# Patient Record
Sex: Male | Born: 1975 | Race: Black or African American | Hispanic: No | Marital: Married | Smoking: Never smoker
Health system: Southern US, Community
[De-identification: ages and names within clinical notes are randomized; demographics above are authoritative.]

## PROBLEM LIST (undated history)

## (undated) DIAGNOSIS — J45909 Unspecified asthma, uncomplicated: Secondary | ICD-10-CM

## (undated) DIAGNOSIS — G43909 Migraine, unspecified, not intractable, without status migrainosus: Secondary | ICD-10-CM

## (undated) DIAGNOSIS — K219 Gastro-esophageal reflux disease without esophagitis: Secondary | ICD-10-CM

## (undated) HISTORY — PX: TONSILLECTOMY: SUR1361

## (undated) HISTORY — PX: APPENDECTOMY: SHX54

## (undated) HISTORY — PX: CHOLECYSTECTOMY: SHX55

---

## 1997-08-12 ENCOUNTER — Emergency Department (HOSPITAL_COMMUNITY): Admission: EM | Admit: 1997-08-12 | Discharge: 1997-08-12 | Payer: Self-pay | Admitting: Emergency Medicine

## 1999-05-19 ENCOUNTER — Emergency Department (HOSPITAL_COMMUNITY): Admission: EM | Admit: 1999-05-19 | Discharge: 1999-05-19 | Payer: Self-pay | Admitting: Emergency Medicine

## 1999-07-02 ENCOUNTER — Emergency Department (HOSPITAL_COMMUNITY): Admission: EM | Admit: 1999-07-02 | Discharge: 1999-07-02 | Payer: Self-pay | Admitting: Emergency Medicine

## 2000-01-11 ENCOUNTER — Emergency Department (HOSPITAL_COMMUNITY): Admission: EM | Admit: 2000-01-11 | Discharge: 2000-01-12 | Payer: Self-pay | Admitting: Internal Medicine

## 2000-01-17 ENCOUNTER — Ambulatory Visit (HOSPITAL_COMMUNITY): Admission: RE | Admit: 2000-01-17 | Discharge: 2000-01-17 | Payer: Self-pay | Admitting: Family Medicine

## 2000-01-24 ENCOUNTER — Encounter: Payer: Self-pay | Admitting: Emergency Medicine

## 2000-01-24 ENCOUNTER — Ambulatory Visit (HOSPITAL_COMMUNITY): Admission: RE | Admit: 2000-01-24 | Discharge: 2000-01-24 | Payer: Self-pay | Admitting: Emergency Medicine

## 2000-04-24 ENCOUNTER — Emergency Department (HOSPITAL_COMMUNITY): Admission: EM | Admit: 2000-04-24 | Discharge: 2000-04-24 | Payer: Self-pay | Admitting: Emergency Medicine

## 2000-04-26 ENCOUNTER — Emergency Department (HOSPITAL_COMMUNITY): Admission: EM | Admit: 2000-04-26 | Discharge: 2000-04-26 | Payer: Self-pay | Admitting: Emergency Medicine

## 2000-08-17 ENCOUNTER — Encounter: Payer: Self-pay | Admitting: Family Medicine

## 2000-08-17 ENCOUNTER — Ambulatory Visit (HOSPITAL_COMMUNITY): Admission: RE | Admit: 2000-08-17 | Discharge: 2000-08-17 | Payer: Self-pay | Admitting: Family Medicine

## 2000-09-06 ENCOUNTER — Emergency Department (HOSPITAL_COMMUNITY): Admission: EM | Admit: 2000-09-06 | Discharge: 2000-09-06 | Payer: Self-pay | Admitting: Emergency Medicine

## 2000-12-12 ENCOUNTER — Emergency Department (HOSPITAL_COMMUNITY): Admission: EM | Admit: 2000-12-12 | Discharge: 2000-12-12 | Payer: Self-pay | Admitting: Emergency Medicine

## 2000-12-12 ENCOUNTER — Encounter: Payer: Self-pay | Admitting: Emergency Medicine

## 2000-12-14 ENCOUNTER — Emergency Department (HOSPITAL_COMMUNITY): Admission: EM | Admit: 2000-12-14 | Discharge: 2000-12-15 | Payer: Self-pay | Admitting: Emergency Medicine

## 2000-12-14 ENCOUNTER — Encounter: Payer: Self-pay | Admitting: Emergency Medicine

## 2001-04-13 ENCOUNTER — Encounter: Admission: RE | Admit: 2001-04-13 | Discharge: 2001-04-13 | Payer: Self-pay | Admitting: Internal Medicine

## 2001-04-13 ENCOUNTER — Encounter: Payer: Self-pay | Admitting: Internal Medicine

## 2001-04-16 ENCOUNTER — Emergency Department (HOSPITAL_COMMUNITY): Admission: EM | Admit: 2001-04-16 | Discharge: 2001-04-16 | Payer: Self-pay | Admitting: Physical Therapy

## 2001-07-13 ENCOUNTER — Encounter: Payer: Self-pay | Admitting: Internal Medicine

## 2001-07-16 ENCOUNTER — Encounter: Payer: Self-pay | Admitting: Internal Medicine

## 2001-07-16 ENCOUNTER — Ambulatory Visit (HOSPITAL_COMMUNITY): Admission: RE | Admit: 2001-07-16 | Discharge: 2001-07-16 | Payer: Self-pay | Admitting: Internal Medicine

## 2002-03-01 ENCOUNTER — Emergency Department (HOSPITAL_COMMUNITY): Admission: EM | Admit: 2002-03-01 | Discharge: 2002-03-01 | Payer: Self-pay | Admitting: Emergency Medicine

## 2002-04-13 ENCOUNTER — Encounter: Admission: RE | Admit: 2002-04-13 | Discharge: 2002-04-13 | Payer: Self-pay | Admitting: Internal Medicine

## 2002-04-13 ENCOUNTER — Encounter: Payer: Self-pay | Admitting: Internal Medicine

## 2003-03-21 ENCOUNTER — Emergency Department (HOSPITAL_COMMUNITY): Admission: EM | Admit: 2003-03-21 | Discharge: 2003-03-21 | Payer: Self-pay

## 2003-06-06 ENCOUNTER — Inpatient Hospital Stay (HOSPITAL_COMMUNITY): Admission: EM | Admit: 2003-06-06 | Discharge: 2003-06-11 | Payer: Self-pay | Admitting: Emergency Medicine

## 2003-06-09 ENCOUNTER — Encounter (INDEPENDENT_AMBULATORY_CARE_PROVIDER_SITE_OTHER): Payer: Self-pay | Admitting: Cardiology

## 2003-07-19 ENCOUNTER — Encounter: Payer: Self-pay | Admitting: Internal Medicine

## 2004-05-09 ENCOUNTER — Ambulatory Visit: Payer: Self-pay | Admitting: Internal Medicine

## 2006-01-14 ENCOUNTER — Emergency Department (HOSPITAL_COMMUNITY): Admission: EM | Admit: 2006-01-14 | Discharge: 2006-01-15 | Payer: Self-pay | Admitting: Emergency Medicine

## 2006-08-19 ENCOUNTER — Emergency Department (HOSPITAL_COMMUNITY): Admission: EM | Admit: 2006-08-19 | Discharge: 2006-08-20 | Payer: Self-pay | Admitting: Emergency Medicine

## 2007-11-10 ENCOUNTER — Encounter: Admission: RE | Admit: 2007-11-10 | Discharge: 2007-11-10 | Payer: Self-pay | Admitting: Family Medicine

## 2008-07-12 DIAGNOSIS — Z862 Personal history of diseases of the blood and blood-forming organs and certain disorders involving the immune mechanism: Secondary | ICD-10-CM

## 2008-07-12 DIAGNOSIS — K219 Gastro-esophageal reflux disease without esophagitis: Secondary | ICD-10-CM | POA: Insufficient documentation

## 2008-07-12 DIAGNOSIS — K625 Hemorrhage of anus and rectum: Secondary | ICD-10-CM

## 2008-07-12 DIAGNOSIS — E785 Hyperlipidemia, unspecified: Secondary | ICD-10-CM

## 2008-07-12 DIAGNOSIS — R109 Unspecified abdominal pain: Secondary | ICD-10-CM | POA: Insufficient documentation

## 2008-07-13 ENCOUNTER — Ambulatory Visit: Payer: Self-pay | Admitting: Internal Medicine

## 2008-07-17 ENCOUNTER — Ambulatory Visit: Payer: Self-pay | Admitting: Internal Medicine

## 2010-06-28 NOTE — Consult Note (Signed)
NAME:  Randy Lowe, Randy Lowe                        ACCOUNT NO.:  [id number]   MEDICAL RECORD NO.:  [id number]                   PATIENT TYPE:  INP   LOCATION:  3737                                 FACILITY:  [facility]   PHYSICIAN:  Michael L. Thad Ranger, M.D.           DATE OF BIRTH:  1975/08/12   DATE OF CONSULTATION:  06/09/2003  DATE OF DISCHARGE:                                   CONSULTATION   REASON FOR CONSULTATION:  Syncope and weakness.   HISTORY OF PRESENT ILLNESS:  An inpatient consultation visit for this  existing [facility] patient, a 35 year old man, a couple of times  in the office in 2002 for a questionable syncopal episode and tension  vascular headaches.  MRI and MRA at that time were unremarkable.  The  patient was admitted Tuesday to the cardiology service due to an unusual  spell.  The patient reports that while sitting down at his desk for lunch  he began to experience chest pain.  He subsequently went out to lunch and  went to the bank but the chest pain gradually got worse.   After lunch, he came back to his desk and sat down.  He then remembers  feeling very lightheaded and then loss consciousness.  He does not really  remember much about the ensuing few hours.  According to his wife, who was  available in the room but who did not witness the episode first hand, he was  limp and that his head was hanging.  He was only minimally responsive  but apparently did try to respond to stimuli.  There was nothing that looked  like focal or generalized activity.  The patient did not suffer any injury.  EMS was alerted and the patient was brought to [facility]  Emergency Room where he had a complaint of chest pain and feeling poorly.  He was speaking in whispers but otherwise did not seem to have any  significant neurologic deficits.   He was admitted for further considerations.  We are concerned that this  might be a psychogenic phenomenon.  Dr. Antonietta Breach was consulted but he  denied any particular psychiatric syndrome except for possible adjustment  disorder.   The patient continued to complain of ongoing weakness and neurologic  consultation is requested for evaluation of this.  The patient currently  says that he has felt better.  Mostly, he simply that he feels tired.   PAST MEDICAL HISTORY:  1. She had a false positive Cardiolite, subsequent negative cardiac     catheterization as part of a chest pain workup in February of this year.  2. He had the aforementioned headache and syncope workup in 2002.  3. He has had a couple of MRIs of the abdomen the last few years the reasons     for which are unclear to me.  4. He does have anemia.  Workup is negative.  Probably has a thalassemia.  5. He has had appendectomy and cholecystectomy in the past.  6. As a child, he had frequent respiratory problems as well as migratory     joint pains.   SOCIAL HISTORY:  As outlined in the admission H&P of June 06, 2003.   FAMILY HISTORY:  As outlined in the admission H&P of June 06, 2003.   REVIEW OF SYMPTOMS:  As outlined in the admission H&P of June 06, 2003.   MEDICATIONS:  He was taking no medications prior to admission.  In the  hospital, he has been on Protonix, Relafen, and a prednisone taper.   PHYSICAL EXAMINATION:  VITAL SIGNS:  Temperature 98.2, blood pressure  116/70, pulse 57, respirations 20.  O2 saturation 100% on two liters nasal  cannula.  GENERAL:  This is a healthy-appearing man seated and in no evident distress.  HEENT:  Normocephalic and atraumatic.  Oropharynx benign.  NECK:  Supple without carotid bruits.  HEART:  Bradycardic, regular rhythm, no murmurs.  NEUROLOGICAL:  Mental status:  He is awake, alert, and fully oriented.  Recent and remote memory are intact.  Attention, concentration, and fund of  knowledge were appropriate.  He has no defect to confrontation or naming.  Mood is euthymic.  It looks flat  and a little depressed.  Cranial nerves  show the fundi are poorly visualized.  Pupils are equal and briskly  reactive.  Extraocular movements are full without nystagmus.  Visual fields  are full to confrontation.  Hearing is intact to conversation and speech.  Facial sensation is intact to pinprick.  Face, tongue, and palate move  normally and symmetrically.  Shoulder shrug is normal.  Motor testing shows  normal bulk and tone.  There is normal strength in all tested extremity  muscles with a bit of give way throughout.  Sensation intact to light touch  and pinprick in all extremities.  Coordination and rapid movements were  performed well.  Finger-to-nose and heel-to-shin were performed well.  Gait  shows he rises from a chair slowly but can do so under his own power.  He is  able to ambulate independently with contact guard assist.  Complaining  unsteadiness but demonstrating fairly good balance.  He does not follow the  Romberg maneuver.  Reflexes are 2+ and symmetric.  Toes are downgoing.   LABORATORY DATA:  CBC shows a white count of 9.2, hemoglobin 13.5, platelets  258,000.  BMET unremarkable except for a mildly low potassium at 3.2 and a  mildly elevated glucose of 1.6.  Sedimentation rate is 1.  CK is negative.  TSH is normal.  Urine drug screen is positive for opiates.   He has had no neural imaging during this admission.  He did have a MRI of  the brain ordered by Dr. Fleet Contras on April 13, 2003 which was  interpreted as normal.  He also had a normal study through our office in  2002 as noted above.   IMPRESSION:  1. Transient alteration of consciousness, etiology is uncertain.  I doubt     that he had a seizure.  Other consideration included a syncope, vasovagal     episode, most likely psychogenic event.  2. Weakness:  Strength is normal to my examination today and he does not     demonstrate any evidence of any particular identifiable neuromuscular    syndrome.  His  primary complaint actually is malaise.  3. History of migraine.   RECOMMENDATIONS:  We  will check an EEG.  It is okay to do this as an  outpatient.  I do not have any further specific recommendations for workup.  His most likely primary problem is anxiety and it may benefit him to be on  medication for this.  I thank you for the consultation.                                               Michael L. Thad Ranger, M.D.    MLR/MEDQ  D:  06/09/2003  T:  06/09/2003  Job:  [id number]

## 2010-06-28 NOTE — H&P (Signed)
NAME:  Randy Lowe, Randy Lowe                        ACCOUNT NO.:  [id number]   MEDICAL RECORD NO.:  [id number]                   PATIENT TYPE:  INP   LOCATION:  1830                                 FACILITY:  [facility]   PHYSICIAN:  Nanetta Batty, M.D.                DATE OF BIRTH:  05-26-1975   DATE OF ADMISSION:  06/06/2003  DATE OF DISCHARGE:                                HISTORY & PHYSICAL   CHIEF COMPLAINT:  Chest pain.   BRIEF HISTORY:  The patient is a 35 year old black male who developed chest  pain while at work sitting at his desk.  He described a sharp stabbing pain.  He got up and went to the bank.  On return, felt worse and developed some  shortness of breath and nausea. The patient reports he does not know what  happened after that.  EMS was called.  The patient responded only in  whispers with poor comprehension.  They attempted to call his wife and had  him talk to her on the phone, and he was unable to respond.  He was  subsequently transported to the [facility] ER.   Pain persisted until given nitroglycerin by EMTs wit a reported 12/10 chest  pain described initially with 4/10 and to 1/10 after nitroglycerin was  given. This is from the ENT report.  The patient had some complaints of  ongoing pain.  No shortness of breath, no PND, no orthopnea.  He is still  speaking in whispers, says he still feels terrible.  He has had similar  chest pain only two weeks ago, but it lasted only 30 minutes.  EKG in the ER  shows sinus rhythm with no ST-T wave changes.  Labs at point-of-care: CK-MB  2.2, troponin less than 0.05, myoglobin 108.  Sodium 140, potassium 3.4,  chloride 104, BUN 15, creatinine 1, glucose 113.  Hemoglobin 166, hematocrit  48. The pH is 7.37, bicarb 25.7, total CO2 27.   He was seen and evaluated by Dr. Allyson Sabal.  He is admitted for observation.  Dr. Allyson Sabal is concerned that this is more of a conversion reaction and not an  organic problem.  He has a prior  false positive Cardiolite and cardiac  catheterization March 30, 2003, which was negative.   PAST MEDICAL HISTORY:  1. MRI of the brain March 2004 negative.  2. MRI of the abdomen June 2003 and July 2002 negative.  3. CT of the brain x 3 in 2002 were negative, and he was diagnosed with     migraines.  4. Status post cholecystectomy.  5. Status post false positive Cardiolite February 2005 followed by cardiac     catheterization March 30, 2003, which showed normal coronary arteries,     normal LV function with no mitral regurgitation or mitral valve prolapse.  6. He has also undergone amenia workup, and hemoglobinopathy profile was     normal.  Iron TIBC was also normal.   CURRENT MEDICATIONS:  None.   ALLERGIES:  None.   REVIEW OF SYSTEMS:  He is working full time and has four kids, ages 68 to 8  months.  No syncope or other cardiovascular symptoms reported at this time.  CARDIAC:  Positive chest pain, see HPI.  PULMONARY:  He denies shortness of  breath except with the onset of symptoms.  No PND, no orthopnea, no dyspnea  on exertion.  GI:  Negative for GERD.  His weight is down slightly secondary  to a diet.  GU:  Negative.  He says he can maintain an erection without  difficulty.  EXTREMITIES:  Weak with these episodes but no other complaints.   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  He is  going to school for computer programming, and he has four kids as noted  above.   FAMILY HISTORY:  Positive for hypertension.  His father, whom he does not  know very well, has cancer and coronary artery disease.   PHYSICAL EXAMINATION:  GENERAL:  Well-nourished, well-developed black male  in no acute distress.  He speaks in whispers.  Eyes are frequently closed.  VITAL SIGNS: Blood pressure 132/70, heart rate 70 in sinus rhythm,  respiratory rate 20, O2 saturation 97% on room air.  HEENT:  Head: Normocephalic.  Eyes:  PERRLA. EOMs intact.  Fundi not   visualized.  Ears, nose, throat, and mouth grossly  within normal limits.  NECK:  No bruits, no JVD, no thyromegaly.  CHEST:  Clear to auscultation and percussion.  HEART:  Normal S1 and S2.  No murmurs or rubs.  ABDOMEN:  Soft, nontender.  Positive bowel sounds.  No hepatosplenomegaly.  GU/RECTAL:  Deferred, not medically indicated.  EXTREMITIES:  No clubbing, cyanosis, or edema.  NEUROLOGIC:  No focal deficits.   IMPRESSION:  1. Chest pain with normal cardiac catheterization.  2. Rule out conversion reaction.   PLAN:  1. Admit for 24-hour observation.  H2 blockers, and psychiatric consult.     Dr. Jeanie Sewer has been contacted.      Eber Hong, P.A.                 Nanetta Batty, M.D.    WDJ/MEDQ  D:  06/06/2003  T:  06/06/2003  Job:  [id number]

## 2010-06-28 NOTE — Discharge Summary (Signed)
NAME:  Randy Lowe, Randy Lowe                        ACCOUNT NO.:  [id number]   MEDICAL RECORD NO.:  [id number]                   PATIENT TYPE:  INP   LOCATION:  3737                                 FACILITY:  [facility]   PHYSICIAN:  Nanetta Batty, M.D.                DATE OF BIRTH:  March 24, 1975   DATE OF ADMISSION:  06/06/2003  DATE OF DISCHARGE:  06/11/2003                                 DISCHARGE SUMMARY   HISTORY OF PRESENT ILLNESS:  Mr. Sascha Baugher is a 35 year old African  American male who developed chest pain at work while at his desk.  It was a  sharp stabbing pain.  He developed some shortness of breath and nausea with  it.  It was recurrent.  EMS was called.  He apparently has undergone cardiac  catheterization on March 30, 2003, at which time he had normal coronary  arteries.  He has had a CT of his brain in 2002 for migraines.  It was  negative.  He has had an MR of his abdomen in June of 2003 and in July of  2002, which was negative.  He had another MRI of his brain in March of 2004,  which was also negative.  He has a normal LV.  He was seen by Nanetta Batty, M.D., in the ER.  He felt that it was probably not his heart given  the extensive recent workup that he has had.  He also thought that it was  not related to pulmonary edema and there may be a psychosomatic component to  it.   HOSPITAL COURSE:  He continued to have chest pain with worsening when he got  up and walked.  He did undergo CT scan of his chest, which was negative for  pulmonary emboli.  We then asked Dr. Jeanie Sewer to come to see him.  He was  also having a difficult time ambulating and needed assistance.  He would  occasionally lean to the left and the right knee started to buckle.  He also  complained about a blurry feeling and some presyncope symptoms when he got  up and walked.  He did have orthostatic blood pressures done and they were  negative.  Lying he was 120/80 and standing 140/90.  He did have  a consult  by Dr. Jeanie Sewer, who felt that he had no clear evidence of excessive  repression resulting in dissociated decreased memory.  He felt maybe that  therapy to increase his coping skills and stress management would do some  help for him.  However, the patient was indecisive if he wanted to have this  done.  On the morning of Jun 11, 2003, he was seen by Nanetta Batty, M.D.,  and considered stable to be discharged home.  He was then started on Zoloft.  He was also started on some meclozine to see if this would not help him  symptomatically.  LABORATORIES:  Hemoglobin 13.5, hematocrit 43, platelets 258.  ESR was 1.  Sodium 140, potassium 3.4, glucose 113, BUN 15, creatinine 1.0.  CK-MBs  were:  #1 312/3.1, #2 248/2.7, #3 214/2.2, #4 87/1.4.  His troponins were  all negative x 3 at less than 0.01.  Fasting lipids showed an HDL of 36, LDL  125, total cholesterol 174, and triglycerides 64.  The TSH was 1.5.  Urine  was negative for any pathology.   DISCHARGE MEDICATIONS:  1. Protonix 40 mg every day or Prilosec over the counter 20 mg once per day     x 2 weeks and then as needed.  2. Darvocet.  He can take as needed every six hours.  3. Motrin 400 mg one tablet t.i.d. for five days and then he is to take it     as needed only.  4. Zoloft 50 mg once per day.  5. Meclozine 25 mg.  He may take for extreme dizzy symptoms.   ACTIVITY:  He is to see his primary doctor, Dr. Everlene Other, before he goes back  to work.   FOLLOWUP:  If he needs counseling, he was left with a number that he could  call to get stress management counseling.  He will follow up with Dr. Clarene Duke  on Jul 03, 2003, at 10:30 a.m.  He will follow up with Dr. Everlene Other in one  week.  He will follow up with Dr. Anne Hahn if needed.   DISCHARGE DIAGNOSES:  1. Chest pain, not cardiac related.  CT of the chest was negative for     pulmonary emboli.  2. Weakness and malaise.  3. Normal ejection fraction.  4. Questionable  somatoform condition related to stress.      Lezlie Octave, N.P.                        Nanetta Batty, M.D.    BB/MEDQ  D:  08/07/2003  T:  08/07/2003  Job:  [id number]   cc:   Antonietta Breach, M.D.  [address]  [place], [place] [address]  Fax: [phone]   Tracey Harries, M.D.  [address]  [place]  [place] [address]  Fax: [phone]

## 2010-11-26 LAB — I-STAT 8, (EC8 V) (CONVERTED LAB)
BUN: 14
Chloride: 104
Glucose, Bld: 91
HCT: 53 — ABNORMAL HIGH
Hemoglobin: 18 — ABNORMAL HIGH
Operator id: 192351
pCO2, Ven: 53.6 — ABNORMAL HIGH

## 2010-11-26 LAB — POCT I-STAT CREATININE
Creatinine, Ser: 1.2
Operator id: 192351

## 2010-11-26 LAB — POCT CARDIAC MARKERS: Operator id: 192351

## 2011-02-17 ENCOUNTER — Ambulatory Visit
Admission: RE | Admit: 2011-02-17 | Discharge: 2011-02-17 | Disposition: A | Discharge status: Home / Self Care | Payer: No Typology Code available for payment source | Source: Ambulatory Visit | Attending: Physician Assistant | Admitting: Physician Assistant

## 2011-02-17 ENCOUNTER — Other Ambulatory Visit: Payer: Self-pay | Admitting: Physician Assistant

## 2011-02-17 DIAGNOSIS — R0602 Shortness of breath: Secondary | ICD-10-CM

## 2011-02-17 DIAGNOSIS — R05 Cough: Secondary | ICD-10-CM

## 2011-10-09 ENCOUNTER — Other Ambulatory Visit: Payer: Self-pay | Admitting: Otolaryngology

## 2013-02-20 ENCOUNTER — Emergency Department (HOSPITAL_COMMUNITY)
Admission: EM | Admit: 2013-02-20 | Discharge: 2013-02-20 | Disposition: A | Discharge status: Home / Self Care | Payer: 59 | Attending: Emergency Medicine | Admitting: Emergency Medicine

## 2013-02-20 ENCOUNTER — Emergency Department (HOSPITAL_COMMUNITY): Payer: 59

## 2013-02-20 ENCOUNTER — Encounter (HOSPITAL_COMMUNITY): Payer: Self-pay | Admitting: Emergency Medicine

## 2013-02-20 DIAGNOSIS — M25569 Pain in unspecified knee: Secondary | ICD-10-CM

## 2013-02-20 MED ORDER — TRAMADOL HCL 50 MG PO TABS: 50.0000 mg | ORAL_TABLET | Freq: Four times a day (QID) | ORAL | Status: DC | PRN

## 2013-02-20 NOTE — ED Notes (Signed)
Pt c/o bilateral knee/foot pain since October.  States that he has not seen anyone about it.  Denies injury.

## 2013-02-20 NOTE — Discharge Instructions (Signed)
Musculoskeletal Pain Musculoskeletal pain is muscle and boney aches and pains. These pains can occur in any part of the body. Your caregiver may treat you without knowing the cause of the pain. They may treat you if blood or urine tests, X-rays, and other tests were normal.  CAUSES There is often not a definite cause or reason for these pains. These pains may be caused by a type of germ (virus). The discomfort may also come from overuse. Overuse includes working out too hard when your body is not fit. Boney aches also come from weather changes. Bone is sensitive to atmospheric pressure changes. HOME CARE INSTRUCTIONS   Ask when your test results will be ready. Make sure you get your test results.  Only take over-the-counter or prescription medicines for pain, discomfort, or fever as directed by your caregiver. If you were given medications for your condition, do not drive, operate machinery or power tools, or sign legal documents for 24 hours. Do not drink alcohol. Do not take sleeping pills or other medications that may interfere with treatment.  Continue all activities unless the activities cause more pain. When the pain lessens, slowly resume normal activities. Gradually increase the intensity and duration of the activities or exercise.  During periods of severe pain, bed rest may be helpful. Lay or sit in any position that is comfortable.  Putting ice on the injured area.  Put ice in a bag.  Place a towel between your skin and the bag.  Leave the ice on for 15 to 20 minutes, 3 to 4 times a day.  Follow up with your caregiver for continued problems and no reason can be found for the pain. If the pain becomes worse or does not go away, it may be necessary to repeat tests or do additional testing. Your caregiver may need to look further for a possible cause. SEEK IMMEDIATE MEDICAL CARE IF:  You have pain that is getting worse and is not relieved by medications.  You develop chest pain  that is associated with shortness or breath, sweating, feeling sick to your stomach (nauseous), or throw up (vomit).  Your pain becomes localized to the abdomen.  You develop any new symptoms that seem different or that concern you. MAKE SURE YOU:   Understand these instructions.  Will watch your condition.  Will get help right away if you are not doing well or get worse. Document Released: 01/27/2005 Document Revised: 04/21/2011 Document Reviewed: 10/01/2012 [facility] Patient Information 2014 [place], [place].  Knee Pain Knee pain can be a result of an injury or other medical conditions. Treatment will depend on the cause of your pain. HOME CARE  Only take medicine as told by your doctor.  Keep a healthy weight. Being overweight can make the knee hurt more.  Stretch before exercising or playing sports.  If there is constant knee pain, change the way you exercise. Ask your doctor for advice.  Make sure shoes fit well. Choose the right shoe for the sport or activity.  Protect your knees. Wear kneepads if needed.  Rest when you are tired. GET HELP RIGHT AWAY IF:   Your knee pain does not stop.  Your knee pain does not get better.  Your knee joint feels hot to the touch.  You have a fever. MAKE SURE YOU:   Understand these instructions.  Will watch this condition.  Will get help right away if you are not doing well or get worse. Document Released: 04/25/2008 Document Revised: 04/21/2011 Document  Reviewed: 04/25/2008 ExitCare Patient Information 2014 [place], [place].

## 2013-02-20 NOTE — ED Provider Notes (Signed)
CSN: [id number]     Arrival date & time 02/20/13  1656 History  This chart was scribed for non-physician practitioner, Junious Silk, PA-C,working with Shon Baton, MD, by Karle Plumber, ED Scribe.  This patient was seen in room WTR6/WTR6 and the patient's care was started at 5:50 PM.  Chief Complaint  Patient presents with  . Knee Pain   The history is provided by the patient. No language interpreter was used.   HPI Comments:  Randy Lowe is a 38 y.o. male who presents to the Emergency Department complaining of bilateral knee pain with the worst being his right knee for the last three months. He states he feels as if his left knee is hurting due to over compensating for his right knee. Pt states his pain has been worsening and prevents him from moving in certain directions. He states getting up from a sitting position and getting into his car makes the pain worse and has even fallen from these actions. He reports taking Ibuprofen and wearing a knee brace on his right knee with no relief from either. He states he injured the knee several years ago, but denies any recent injury. He has never had any surgeries to this knee. He denies fever, chills, nausea, vomiting, SOB, CP, or trouble breathing.   History reviewed. No pertinent past medical history. No past surgical history on file. No family history on file. History  Substance Use Topics  . Smoking status: Never Smoker   . Smokeless tobacco: Not on file  . Alcohol Use: No    Review of Systems  Constitutional: Negative for fever.  Musculoskeletal: Positive for arthralgias (bilateral knee pain).  All other systems reviewed and are negative.    Allergies  Meloxicam  Home Medications  No current outpatient prescriptions on file. Triage Vitals: BP 158/94  Pulse 87  Temp(Src) 98.9 F (37.2 C) (Oral)  SpO2 99% Physical Exam  Nursing note and vitals reviewed. Constitutional: He is oriented to person, place, and time.  He appears well-developed and well-nourished. No distress.  HENT:  Head: Normocephalic and atraumatic.  Right Ear: External ear normal.  Left Ear: External ear normal.  Nose: Nose normal.  Eyes: Conjunctivae are normal.  Neck: Normal range of motion. No tracheal deviation present.  Cardiovascular: Normal rate, regular rhythm and normal heart sounds.   Pulses:      Posterior tibial pulses are 2+ on the right side, and 2+ on the left side.  Pulmonary/Chest: Effort normal and breath sounds normal. No stridor.  Abdominal: Soft. He exhibits no distension. There is no tenderness.  Musculoskeletal: Normal range of motion. He exhibits tenderness. He exhibits no edema.  Positive apprehension sign on right knee. Tender to palpation on medial joint line on right.  No calf pain or swelling.   Neurological: He is alert and oriented to person, place, and time.  Skin: Skin is warm and dry. He is not diaphoretic.  Psychiatric: He has a normal mood and affect. His behavior is normal.    ED Course  Procedures (including critical care time) DIAGNOSTIC STUDIES: Oxygen Saturation is 99% on RA, normal by my interpretation.   COORDINATION OF CARE: 5:55 PM-Will X-Ray right knee. Pt verbalizes understanding and agrees to plan.  Medications - No data to display  Labs Review Labs Reviewed - No data to display Imaging Review Dg Knee Complete 4 Views Right  02/20/2013   CLINICAL DATA:  Three-month history of anterior right knee pain. No known injuries.  EXAM:  RIGHT KNEE - COMPLETE 4+ VIEW  COMPARISON:  None.  FINDINGS: No evidence of acute or subacute fracture or dislocation. Well preserved joint spaces. Well preserved bone mineral density. Small joint effusion suspected.  IMPRESSION: No osseous abnormality.  Small joint effusion suspected.   Electronically Signed   By: Hulan Saashomas  Lawrence M.D.   On: 02/20/2013 18:10    EKG Interpretation   None       MDM   1. Knee pain, unspecified laterality     Patient presents to ED with knee pain, right worse than left. XR is unremarkable. Compartment soft, neurovascularly intact. Patient can walk, but states it is painful. Well preserved joint spaces. Patient was given orthopedic follow up. I discussed return instructions with patient. Vital signs stable for discharge. Patient / Family / Caregiver informed of clinical course, understand medical decision-making process, and agree with plan.   I personally performed the services described in this documentation, which was scribed in my presence. The recorded information has been reviewed and is accurate.    Mora BellmanHannah S Pinkney Venard, PA-C 02/20/13 [id number]

## 2013-02-21 NOTE — ED Provider Notes (Signed)
Medical screening examination/treatment/procedure(s) were performed by non-physician practitioner and as supervising physician I was immediately available for consultation/collaboration.  EKG Interpretation   None        Flint Hakeem F Koni Kannan, MD 02/21/13 0022 

## 2013-11-25 ENCOUNTER — Other Ambulatory Visit: Payer: Self-pay

## 2014-08-11 ENCOUNTER — Encounter: Payer: Self-pay | Admitting: Internal Medicine

## 2016-07-21 DIAGNOSIS — J45909 Unspecified asthma, uncomplicated: Secondary | ICD-10-CM | POA: Insufficient documentation

## 2017-11-16 DIAGNOSIS — IMO0002 Reserved for concepts with insufficient information to code with codable children: Secondary | ICD-10-CM | POA: Diagnosis present

## 2018-01-05 ENCOUNTER — Encounter (HOSPITAL_COMMUNITY): Payer: Self-pay | Admitting: *Deleted

## 2018-01-05 ENCOUNTER — Observation Stay (HOSPITAL_COMMUNITY)
Admission: EM | Admit: 2018-01-05 | Discharge: 2018-01-06 | Disposition: A | Discharge status: Home / Self Care | Payer: BLUE CROSS/BLUE SHIELD | Attending: Internal Medicine | Admitting: Internal Medicine

## 2018-01-05 ENCOUNTER — Emergency Department (HOSPITAL_COMMUNITY): Payer: BLUE CROSS/BLUE SHIELD

## 2018-01-05 ENCOUNTER — Other Ambulatory Visit: Payer: Self-pay

## 2018-01-05 DIAGNOSIS — Z791 Long term (current) use of non-steroidal anti-inflammatories (NSAID): Secondary | ICD-10-CM | POA: Diagnosis not present

## 2018-01-05 DIAGNOSIS — Z7982 Long term (current) use of aspirin: Secondary | ICD-10-CM | POA: Insufficient documentation

## 2018-01-05 DIAGNOSIS — K219 Gastro-esophageal reflux disease without esophagitis: Secondary | ICD-10-CM | POA: Diagnosis not present

## 2018-01-05 DIAGNOSIS — IMO0002 Reserved for concepts with insufficient information to code with codable children: Secondary | ICD-10-CM | POA: Diagnosis present

## 2018-01-05 DIAGNOSIS — Z9049 Acquired absence of other specified parts of digestive tract: Secondary | ICD-10-CM | POA: Diagnosis not present

## 2018-01-05 DIAGNOSIS — G43901 Migraine, unspecified, not intractable, with status migrainosus: Secondary | ICD-10-CM | POA: Diagnosis not present

## 2018-01-05 DIAGNOSIS — J45909 Unspecified asthma, uncomplicated: Secondary | ICD-10-CM | POA: Diagnosis not present

## 2018-01-05 DIAGNOSIS — D649 Anemia, unspecified: Secondary | ICD-10-CM | POA: Insufficient documentation

## 2018-01-05 DIAGNOSIS — Z888 Allergy status to other drugs, medicaments and biological substances status: Secondary | ICD-10-CM | POA: Diagnosis not present

## 2018-01-05 DIAGNOSIS — Z79899 Other long term (current) drug therapy: Secondary | ICD-10-CM | POA: Insufficient documentation

## 2018-01-05 DIAGNOSIS — G43909 Migraine, unspecified, not intractable, without status migrainosus: Secondary | ICD-10-CM | POA: Diagnosis present

## 2018-01-05 DIAGNOSIS — Z862 Personal history of diseases of the blood and blood-forming organs and certain disorders involving the immune mechanism: Secondary | ICD-10-CM

## 2018-01-05 HISTORY — DX: Gastro-esophageal reflux disease without esophagitis: K21.9

## 2018-01-05 HISTORY — DX: Migraine, unspecified, not intractable, without status migrainosus: G43.909

## 2018-01-05 HISTORY — DX: Unspecified asthma, uncomplicated: J45.909

## 2018-01-05 LAB — COMPREHENSIVE METABOLIC PANEL
ALT: 27 U/L (ref 0–44)
AST: 31 U/L (ref 15–41)
Albumin: 3.7 g/dL (ref 3.5–5.0)
Alkaline Phosphatase: 65 U/L (ref 38–126)
Anion gap: 9 (ref 5–15)
BILIRUBIN TOTAL: 0.3 mg/dL (ref 0.3–1.2)
BUN: 10 mg/dL (ref 6–20)
CO2: 21 mmol/L — ABNORMAL LOW (ref 22–32)
Calcium: 9 mg/dL (ref 8.9–10.3)
Chloride: 107 mmol/L (ref 98–111)
Creatinine, Ser: 1.35 mg/dL — ABNORMAL HIGH (ref 0.61–1.24)
Glucose, Bld: 177 mg/dL — ABNORMAL HIGH (ref 70–99)
POTASSIUM: 4 mmol/L (ref 3.5–5.1)
Sodium: 137 mmol/L (ref 135–145)
Total Protein: 7.2 g/dL (ref 6.5–8.1)

## 2018-01-05 LAB — CBC WITH DIFFERENTIAL/PLATELET
Abs Immature Granulocytes: 0.1 10*3/uL — ABNORMAL HIGH (ref 0.00–0.07)
Basophils Absolute: 0 10*3/uL (ref 0.0–0.1)
Basophils Relative: 0 %
EOS ABS: 0 10*3/uL (ref 0.0–0.5)
EOS PCT: 0 %
HCT: 46.5 % (ref 39.0–52.0)
Hemoglobin: 13.5 g/dL (ref 13.0–17.0)
Lymphocytes Relative: 23 %
Lymphs Abs: 1.4 10*3/uL (ref 0.7–4.0)
MCH: 19 pg — AB (ref 26.0–34.0)
MCHC: 29 g/dL — AB (ref 30.0–36.0)
MCV: 65.6 fL — AB (ref 80.0–100.0)
MONO ABS: 0 10*3/uL — AB (ref 0.1–1.0)
Metamyelocytes Relative: 1 %
Monocytes Relative: 0 %
Neutro Abs: 4.7 10*3/uL (ref 1.7–7.7)
Neutrophils Relative %: 76 %
PLATELETS: 274 10*3/uL (ref 150–400)
RBC: 7.09 MIL/uL — AB (ref 4.22–5.81)
RDW: 17.3 % — AB (ref 11.5–15.5)
WBC: 6.2 10*3/uL (ref 4.0–10.5)
nRBC: 0 % (ref 0.0–0.2)
nRBC: 0 /100 WBC

## 2018-01-05 MED ORDER — DIPHENHYDRAMINE HCL 50 MG/ML IJ SOLN
12.5000 mg | Freq: Once | INTRAMUSCULAR | Status: AC
  Administered 2018-01-05: 12.5 mg via INTRAVENOUS
  Filled 2018-01-05: qty 1

## 2018-01-05 MED ORDER — AMITRIPTYLINE HCL 25 MG PO TABS
25.0000 mg | ORAL_TABLET | Freq: Every day | ORAL | Status: DC
  Administered 2018-01-05: 25 mg via ORAL
  Filled 2018-01-05: qty 1

## 2018-01-05 MED ORDER — ONDANSETRON HCL 4 MG PO TABS: 4.0000 mg | ORAL_TABLET | Freq: Four times a day (QID) | ORAL | Status: DC | PRN

## 2018-01-05 MED ORDER — KETOROLAC TROMETHAMINE 15 MG/ML IJ SOLN
15.0000 mg | Freq: Once | INTRAMUSCULAR | Status: AC
  Administered 2018-01-05: 15 mg via INTRAVENOUS
  Filled 2018-01-05: qty 1

## 2018-01-05 MED ORDER — KETOROLAC TROMETHAMINE 30 MG/ML IJ SOLN
30.0000 mg | Freq: Once | INTRAMUSCULAR | Status: AC
  Administered 2018-01-05: 30 mg via INTRAVENOUS
  Filled 2018-01-05: qty 1

## 2018-01-05 MED ORDER — DEXAMETHASONE SODIUM PHOSPHATE 10 MG/ML IJ SOLN
10.0000 mg | Freq: Once | INTRAMUSCULAR | Status: AC
  Administered 2018-01-05: 10 mg via INTRAVENOUS
  Filled 2018-01-05: qty 1

## 2018-01-05 MED ORDER — LORAZEPAM 2 MG/ML IJ SOLN
1.0000 mg | Freq: Once | INTRAMUSCULAR | Status: AC
  Administered 2018-01-05: 1 mg via INTRAVENOUS
  Filled 2018-01-05: qty 1

## 2018-01-05 MED ORDER — SODIUM CHLORIDE 0.9 % IV BOLUS
1000.0000 mL | Freq: Once | INTRAVENOUS | Status: AC
  Administered 2018-01-05: 1000 mL via INTRAVENOUS

## 2018-01-05 MED ORDER — ACETAMINOPHEN 325 MG PO TABS
650.0000 mg | ORAL_TABLET | Freq: Four times a day (QID) | ORAL | Status: DC | PRN
  Administered 2018-01-06: 650 mg via ORAL
  Filled 2018-01-05: qty 2

## 2018-01-05 MED ORDER — DIHYDROERGOTAMINE MESYLATE 1 MG/ML IJ SOLN
1.0000 mg | Freq: Once | INTRAMUSCULAR | Status: AC
  Administered 2018-01-05: 1 mg via SUBCUTANEOUS
  Filled 2018-01-05: qty 1

## 2018-01-05 MED ORDER — ONDANSETRON HCL 4 MG/2ML IJ SOLN: 4.0000 mg | Freq: Four times a day (QID) | INTRAMUSCULAR | Status: DC | PRN

## 2018-01-05 MED ORDER — MAGNESIUM SULFATE 2 GM/50ML IV SOLN
2.0000 g | Freq: Once | INTRAVENOUS | Status: AC
  Administered 2018-01-05: 2 g via INTRAVENOUS
  Filled 2018-01-05: qty 50

## 2018-01-05 MED ORDER — METOCLOPRAMIDE HCL 5 MG/ML IJ SOLN
10.0000 mg | Freq: Once | INTRAMUSCULAR | Status: AC
  Administered 2018-01-05: 10 mg via INTRAVENOUS
  Filled 2018-01-05: qty 2

## 2018-01-05 MED ORDER — ZOLPIDEM TARTRATE 5 MG PO TABS
5.0000 mg | ORAL_TABLET | Freq: Once | ORAL | Status: AC
  Administered 2018-01-06: 5 mg via ORAL
  Filled 2018-01-05: qty 1

## 2018-01-05 MED ORDER — DIPHENHYDRAMINE HCL 50 MG/ML IJ SOLN
25.0000 mg | Freq: Once | INTRAMUSCULAR | Status: AC
  Administered 2018-01-05: 25 mg via INTRAVENOUS
  Filled 2018-01-05: qty 1

## 2018-01-05 MED ORDER — ACETAMINOPHEN 650 MG RE SUPP: 650.0000 mg | Freq: Four times a day (QID) | RECTAL | Status: DC | PRN

## 2018-01-05 NOTE — ED Notes (Signed)
Pt placed on cardiac monitor. NSR at this time.

## 2018-01-05 NOTE — ED Triage Notes (Signed)
Pt has been having a migraine for the past 2 days unrelieved with OTC meds. Hx of same; reports photophobia as well

## 2018-01-05 NOTE — ED Provider Notes (Signed)
[facility] EMERGENCY DEPARTMENT Provider Note   CSN: [id number] Arrival date & time: 01/05/18  0539     History   Chief Complaint Chief Complaint  Patient presents with  . Migraine    HPI Randy Lowe is a 42 y.o. male with past medical history significant for recurrent migraines who presents for evaluation of migraine headache. Symptoms began two days ago. Admits to left sided temporal headache. Describes it has a throbbing sensation. Denies radiation. States the pain began gradually and has increased over time. Rates his pain a 10/10. States that he was on Imatrex his migraines, however the patient stopped working and he no longer has access to it.  Patient states his neurologist, [facility] neurolog[facility], recently changed over his medicines to Indocin and amitriptyline.  She states these medicines have not helped with his headaches since he started them recurrently approximately 7 weeks ago.  Patient states he does have a history of chronic left-sided headache with left-sided weakness.  Patient states he was supposed to have an MRI with [facility] approximately 6 weeks ago, however has not scheduled this.  Admits to photophobia and phonophobia.  Denies fever, chills, neck pain, neck stiffness, chest pain, shortness of breath, cough, hemoptysis, unilateral weakness, slurred speech, eye pain, dizziness or lightheadedness. Patient reports "heavines in his left upper and lower extremities. Has has this in the past with his previous migraines. Denies head trauma or anticoagulation.  History obtained from patient.  No interpreter was used.  HPI  Past Medical History:  Diagnosis Date  . Asthma   . GERD (gastroesophageal reflux disease)   . Migraine     Patient Active Problem List   Diagnosis Date Noted  . HYPERLIPIDEMIA 07/12/2008  . GERD 07/12/2008  . RECTAL BLEEDING 07/12/2008  . ABDOMINAL PAIN, UNSPECIFIED SITE 07/12/2008  . ANEMIA, HX OF 07/12/2008    Past  Surgical History:  Procedure Laterality Date  . APPENDECTOMY    . CHOLECYSTECTOMY    . TONSILLECTOMY          Home Medications    Prior to Admission medications   Medication Sig Start Date End Date Taking? Authorizing Provider  amitriptyline (ELAVIL) 25 MG tablet Take 25 mg by mouth at bedtime. 11/18/17  Yes [provider]  aspirin-acetaminophen-caffeine (EXCEDRIN MIGRAINE) [phone] MG tablet Take 1 tablet by mouth every 6 (six) hours as needed for headache.   Yes [provider]  ibuprofen (ADVIL,MOTRIN) 200 MG tablet Take 800 mg by mouth every 6 (six) hours as needed.   Yes [provider]  indomethacin (INDOCIN SR) 75 MG CR capsule Take 75 mg by mouth as directed. Take 1 capsule at onset of migraine, may repeat after 12 hours if no relief. 11/18/17  Yes [provider]  ondansetron (ZOFRAN-ODT) 4 MG disintegrating tablet Take 4 mg by mouth every 6 (six) hours as needed for nausea/vomiting. 11/18/17  Yes [provider]  traMADol (ULTRAM) 50 MG tablet Take 1 tablet (50 mg total) by mouth every 6 (six) hours as needed. Patient not taking: Reported on 01/05/2018 02/20/13   Junious Silk, PA-C    Family History No family history on file.  Social History Social History   Tobacco Use  . Smoking status: Never Smoker  . Smokeless tobacco: Never Used  Substance Use Topics  . Alcohol use: No  . Drug use: No     Allergies   Meloxicam   Review of Systems Review of Systems  Constitutional: Negative.  HENT: Negative.   Respiratory: Negative.   Cardiovascular: Negative.   Gastrointestinal: Negative.   Genitourinary: Negative.   Musculoskeletal: Negative.   Skin: Negative.   Neurological: Positive for headaches. Negative for dizziness, tremors, seizures, syncope, facial asymmetry, speech difficulty, weakness, light-headedness and numbness.  All other systems reviewed and are negative.    Physical Exam Updated Vital Signs BP  137/89 (BP Location: Left Arm)   Pulse 88   Temp 98.6 F (37 C) (Oral)   Resp 20   Ht 6\' 2"  (1.88 m)   Wt 131.5 kg   SpO2 99%   BMI 37.23 kg/m   Physical Exam  Physical Exam  Constitutional: Pt is oriented to person, place, and time. Pt appears well-developed and well-nourished. No distress.  HENT:  Head: Normocephalic and atraumatic.  Mouth/Throat: Oropharynx is clear and moist.  Eyes: Conjunctivae and EOM are normal. Pupils are equal, round, and reactive to light. No scleral icterus.  No horizontal, vertical or rotational nystagmus  Neck: Normal range of motion. Neck supple.  Full active and passive ROM without pain No midline or paraspinal tenderness No nuchal rigidity or meningeal signs  Cardiovascular: Normal rate, regular rhythm and intact distal pulses.   Pulmonary/Chest: Effort normal and breath sounds normal. No respiratory distress. Pt has no wheezes. No rales.  Abdominal: Soft. Bowel sounds are normal. There is no tenderness. There is no rebound and no guarding.  Musculoskeletal: Normal range of motion.  Lymphadenopathy:    No cervical adenopathy.  Neurological: Pt. is alert and oriented to person, place, and time. He has normal reflexes. No cranial nerve deficit.  Exhibits normal muscle tone. Coordination normal.  Mental Status:  Alert, oriented, thought content appropriate. Speech fluent without evidence of aphasia. Able to follow 2 step commands without difficulty.  Cranial Nerves:  II:  Peripheral visual fields grossly normal, pupils equal, round, reactive to light III,IV, VI: ptosis not present, extra-ocular motions intact bilaterally  V,VII: smile symmetric, facial light touch sensation equal VIII: hearing grossly normal bilaterally  IX,X: midline uvula rise  XI: bilateral shoulder shrug equal and strong XII: midline tongue extension  Motor:  5/5 in upper and lower extremities bilaterally including strong and dorsiflexion/plantar flexion. 4/5 Decreased grip  strength in left hand. Sensory: Pinprick and light touch normal in all extremities.  Deep Tendon Reflexes: 2+ and symmetric  Cerebellar: normal finger-to-nose with bilateral upper extremities Gait: normal gait and balance CV: distal pulses palpable throughout   Skin: Skin is warm and dry. No rash noted. Pt is not diaphoretic.  Psychiatric: Pt has a normal mood and affect. Behavior is normal. Judgment and thought content normal.  Nursing note and vitals reviewed. ED Treatments / Results  Labs (all labs ordered are listed, but only abnormal results are displayed) Labs Reviewed  COMPREHENSIVE METABOLIC PANEL  CBC WITH DIFFERENTIAL/PLATELET    EKG None  Radiology Mr Brain Wo Contrast  Result Date: 01/05/2018 CLINICAL DATA:  42 year old male with worst headache of life. Photophobia. History of migraines. EXAM: MRI HEAD WITHOUT CONTRAST TECHNIQUE: Multiplanar, multiecho pulse sequences of the brain and surrounding structures were obtained without intravenous contrast. COMPARISON:  Skull radiographs 11/10/2007. FINDINGS: Brain: No restricted diffusion to suggest acute infarction. No midline shift, mass effect, evidence of mass lesion, ventriculomegaly, extra-axial collection or acute intracranial hemorrhage. Cervicomedullary junction and pituitary are within normal limits. Cerebral volume is within normal limits. Wallace Cullens and white matter signal is within normal limits throughout the brain. No encephalomalacia or chronic cerebral blood products identified. The  deep gray matter nuclei, brainstem, and cerebellum appear normal. Vascular: Major intracranial vascular flow voids are preserved, the distal right vertebral artery appears dominant. Skull and upper cervical spine: Negative visible cervical spine. Normal bone marrow signal. Sinuses/Orbits: Negative orbits. Trace paranasal sinus mucosal thickening. Other: Mastoid air cells are clear. Grossly normal visible internal auditory structures. Scalp and  face soft tissues appear negative. IMPRESSION: Normal noncontrast MRI appearance of the brain. Electronically Signed   By: Odessa Fleming M.D.   On: 01/05/2018 10:42    Procedures Procedures (including critical care time)  Medications Ordered in ED Medications  diphenhydrAMINE (BENADRYL) injection 12.5 mg (12.5 mg Intravenous Given 01/05/18 0759)  metoCLOPramide (REGLAN) injection 10 mg (10 mg Intravenous Given 01/05/18 0800)  sodium chloride 0.9 % bolus 1,000 mL (0 mLs Intravenous Stopped 01/05/18 0934)  LORazepam (ATIVAN) injection 1 mg (1 mg Intravenous Given 01/05/18 0932)  magnesium sulfate IVPB 2 g 50 mL (0 g Intravenous Stopped 01/05/18 1139)  dexamethasone (DECADRON) injection 10 mg (10 mg Intravenous Given 01/05/18 1113)  ketorolac (TORADOL) 15 MG/ML injection 15 mg (15 mg Intravenous Given 01/05/18 1111)  dihydroergotamine (DHE) injection 1 mg (1 mg Subcutaneous Given 01/05/18 1344)     Initial Impression / Assessment and Plan / ED Course  I have reviewed the triage vital signs and the nursing notes.  Pertinent labs & imaging results that were available during my care of the patient were reviewed by me and considered in my medical decision making (see chart for details).  42 year old male who appears otherwise well presents for evaluation of headache.  Symptom onset 2 days ago.  Patient admits to left-sided headache.  Has history of chronic left-sided headache with associated left-sided weakness. Patient is followed by [facility] neurology. Patient states he supposed to have an MRI approximately 6 weeks ago, however has not scheduled this.  Neurologic exam with decreased grip strength in left hand. No dysmetria. No pronator drift. No facial asymmetry or difficulty with word finding. No code stroke called d/t onset of symptoms >48 hours. Possible complex migraine vs intracranial etiology. Patient is mildly hypertensive in department at 153/94. Feel this is secondary to pain at this time.     Thorough review of past medical history shows patient was seen by [facility] 11/28/2017 for history of chronic migraine with left-sided weakness.  States he was previously seen by [facility] neurology and was referred to [facility] due to intractable headache.  Patient was instructed to have an emergent MRI secondary to weakness, however did not schedule this.  Patient has not followed up with [facility] neurology since this visit.  Will give migraine cocktail and obtain MRI brain w/o and re-evalaute.  0915: Pain on reevaluation not improved with Reglan, benadryl and fluids.   1045: MRI negative. Will trial Toradol, decadron and magnesium as well as high flow oxygen. Patient does not wan to try Depakote.   1230: Pain with improvement from 9/10 to 8/10. Will trial DHE and reevaluate. Patient does not have a history of cardiac disorders. If not improved patient will need to be admitted for status migrainous.   1430: Patient without any relief with DHE, states pain back to a 10/10. Low suspicion for meningitis or other emergent intracranial pathology causing symptoms at this time. Will call for admission for status migranosis.  1530: Discussed with Neurology PA, who agrees for consult  1545: Discussed with Dr. Ophelia Charter, [facility] hospitalist who agree for admission.  Patient is hemodynamically stable and appropriate for admission  at this time.  Patient has been seen an evaluated by my attending Dr. Donnald GarrePfeiffer who agrees with above treatment and disposition.   Final Clinical Impressions(s) / ED Diagnoses   Final diagnoses:  Status migrainosus    ED Discharge Orders    None       Yenifer Saccente A, PA-C 01/05/18 1559    Arby BarrettePfeiffer, Marcy, MD 01/07/18 1246

## 2018-01-05 NOTE — H&P (Signed)
History and Physical    Randy Lowe:[id number] DOB: 11/08/1975 DOA: 01/05/2018  PCP: Patient, No Pcp Per Consultants:  Duanne Moron [facility] Neurology Patient coming from:  Home - lives with wife and 4 children; NOK: Wife, [phone]  Chief Complaint: Migraine  HPI: Randy Lowe is a 42 y.o. male with medical history significant of migraines presenting with a migraine.     He reports a h/o migraine and has a severe migraine, worse than any he has had.  It started fairly mild Sunday PM and "just exploded."  Left temporal headache radiating along the left posterior ear and into the jaw and teeth.  +blurry vision.  +photophobia.  He usually has aura the day before his migraine but he did not have that this time.  Left arm and leg feel heavy and his fingers feel pins and needles on the left.  His left foot occasionally feels like it is asleep.  +nausea, no vomiting.  Intermittent dysphagia with this migraine.     ED Course:  Status migrainous.  H/o chronic migraines, followed by [facility] neurology.  Negative MRI.  Tried multiple medications without relief.  Neurology recommends DHE injections, need [place] to admit.  Review of Systems: As per HPI; otherwise review of systems reviewed and negative.   Ambulatory Status:  Ambulates without assistance or with a cane  Past Medical History:  Diagnosis Date  . Asthma   . GERD (gastroesophageal reflux disease)   . Migraine     Past Surgical History:  Procedure Laterality Date  . APPENDECTOMY    . CHOLECYSTECTOMY    . TONSILLECTOMY      Social History   Socioeconomic History  . Marital status: Married    Spouse name: Not on file  . Number of children: Not on file  . Years of education: Not on file  . Highest education level: Not on file  Occupational History  . Occupation: Tree surgeon  Social Needs  . Financial resource strain: Not on file  . Food insecurity:    Worry: Not on file    Inability: Not on file  . Transportation  needs:    Medical: Not on file    Non-medical: Not on file  Tobacco Use  . Smoking status: Never Smoker  . Smokeless tobacco: Never Used  Substance and Sexual Activity  . Alcohol use: No  . Drug use: No  . Sexual activity: Not on file  Lifestyle  . Physical activity:    Days per week: Not on file    Minutes per session: Not on file  . Stress: Not on file  Relationships  . Social connections:    Talks on phone: Not on file    Gets together: Not on file    Attends religious service: Not on file    Active member of club or organization: Not on file    Attends meetings of clubs or organizations: Not on file    Relationship status: Not on file  . Intimate partner violence:    Fear of current or ex partner: Not on file    Emotionally abused: Not on file    Physically abused: Not on file    Forced sexual activity: Not on file  Other Topics Concern  . Not on file  Social History Narrative  . Not on file    Allergies  Allergen Reactions  . Meloxicam Itching    Family History  Problem Relation Age of Onset  . CAD Father   .  Colon cancer Father   . CVA Maternal Grandmother   . CAD Maternal Grandmother   . CAD Maternal Grandfather   . Migraines Neg Hx     Prior to Admission medications   Medication Sig Start Date End Date Taking? Authorizing Provider  amitriptyline (ELAVIL) 25 MG tablet Take 25 mg by mouth at bedtime. 11/18/17  Yes [provider]  aspirin-acetaminophen-caffeine (EXCEDRIN MIGRAINE) [phone] MG tablet Take 1 tablet by mouth every 6 (six) hours as needed for headache.   Yes [provider]  ibuprofen (ADVIL,MOTRIN) 200 MG tablet Take 800 mg by mouth every 6 (six) hours as needed.   Yes [provider]  indomethacin (INDOCIN SR) 75 MG CR capsule Take 75 mg by mouth as directed. Take 1 capsule at onset of migraine, may repeat after 12 hours if no relief. 11/18/17  Yes [provider]  ondansetron (ZOFRAN-ODT) 4 MG  disintegrating tablet Take 4 mg by mouth every 6 (six) hours as needed for nausea/vomiting. 11/18/17  Yes [provider]  traMADol (ULTRAM) 50 MG tablet Take 1 tablet (50 mg total) by mouth every 6 (six) hours as needed. Patient not taking: Reported on 01/05/2018 02/20/13   Junious SilkMerrell, Hannah, PA-C    Physical Exam: Vitals:   01/05/18 0543 01/05/18 0802 01/05/18 1126 01/05/18 1349  BP: (!) 162/97 (!) 153/94 (!) 142/92 137/89  Pulse: 97 90 79 88  Resp: 16 18 20 20   Temp: 98 F (36.7 C) 98.6 F (37 C)    TempSrc: Oral Oral    SpO2: 99%  100% 99%  Weight:      Height:         General:  Appears calm and comfortable and is NAD Eyes:  PERRL, EOMI, normal lids, iris ENT:  grossly normal hearing, lips & tongue, mmm; appropriate dentition Neck:  no LAD, masses or thyromegaly; no carotid bruits Cardiovascular:  RRR, no m/r/g. No LE edema.  Respiratory:   CTA bilaterally with no wheezes/rales/rhonchi.  Normal respiratory effort. Abdomen:  soft, NT, ND, NABS Back:   normal alignment, no CVAT Skin:  no rash or induration seen on limited exam Musculoskeletal:  grossly normal tone BUE/BLE, good ROM, no bony abnormality - inconsistent effort on exam but appears to be intact Lower extremity:  No LE edema.  Limited foot exam with no ulcerations.  2+ distal pulses. Psychiatric:  grossly normal mood and affect, speech fluent and appropriate, AOx3 Neurologic:  CN 2-12 grossly intact, moves all extremities in coordinated fashion, reports diminished sensation along entire left face/arm/leg    Radiological Exams on Admission: Mr Brain Wo Contrast  Result Date: 01/05/2018 CLINICAL DATA:  42 year old male with worst headache of life. Photophobia. History of migraines. EXAM: MRI HEAD WITHOUT CONTRAST TECHNIQUE: Multiplanar, multiecho pulse sequences of the brain and surrounding structures were obtained without intravenous contrast. COMPARISON:  Skull radiographs 11/10/2007. FINDINGS: Brain: No  restricted diffusion to suggest acute infarction. No midline shift, mass effect, evidence of mass lesion, ventriculomegaly, extra-axial collection or acute intracranial hemorrhage. Cervicomedullary junction and pituitary are within normal limits. Cerebral volume is within normal limits. Wallace CullensGray and white matter signal is within normal limits throughout the brain. No encephalomalacia or chronic cerebral blood products identified. The deep gray matter nuclei, brainstem, and cerebellum appear normal. Vascular: Major intracranial vascular flow voids are preserved, the distal right vertebral artery appears dominant. Skull and upper cervical spine: Negative visible cervical spine. Normal bone marrow signal. Sinuses/Orbits: Negative orbits. Trace paranasal sinus mucosal thickening. Other: Mastoid air  cells are clear. Grossly normal visible internal auditory structures. Scalp and face soft tissues appear negative. IMPRESSION: Normal noncontrast MRI appearance of the brain. Electronically Signed   By: Odessa Fleming M.D.   On: 01/05/2018 10:42    EKG: not done   Labs on Admission: I have personally reviewed the available labs and imaging studies at the time of the admission.  Pertinent labs:   CBC, CMP pending   Assessment/Plan Principal Problem:   Status migrainosus   -Patient presenting with report of migraine -While he reports new neurologic symptoms, it appears that he had similar symptoms at an outpatient consult appointment on 11/18/17 -His PE is unremarkable including neurologically, although there appears to be inconsistent effort intermittently -He is sitting in a well lighted hallway and is alert, appropriate, and fairly jovial -MRI was negative -Overall low suspicion for serious condition -Will observe overnight -Did not appear to respond to ER treatments including Toradol, Ativan, Magnesium, Decadron, and Reglan -Neurology consulted and has ordered DHE -Anticipate d/c to home tomorrow    DVT  prophylaxis: Early ambulation Code Status:  Full - confirmed with patient/family Family Communication: Son and daughter present throughout evaluation  Disposition Plan:  Home once clinically improved Consults called: Neurology  Admission status: It is my clinical opinion that referral for OBSERVATION is reasonable and necessary in this patient based on the above information provided. The aforementioned taken together are felt to place the patient at high risk for further clinical deterioration. However it is anticipated that the patient may be medically stable for discharge from the hospital within 24 to 48 hours.    Jonah Blue MD [facility]  If note is complete, please contact covering daytime or nighttime physician. [email or web address] Password [facility]  01/05/2018, 4:18 PM

## 2018-01-05 NOTE — ED Provider Notes (Signed)
Medical screening examination/treatment/procedure(s) were conducted as a shared visit with non-physician practitioner(s) and myself.  I personally evaluated the patient during the encounter.  None Patient with past medical history significant for migraines.  Current headache has been ongoing for 2 days.  Patient reports that he has left-sided weakness with his headache.  Patient is scheduled to get an MRI 6 weeks ago but has not had it done yet.  I have evaluated the patient after treatment with migraine cocktail.  He is alert and appropriate without confusion or somnolence but reports headache still present.  Movements coordinated purposeful symmetric.  Patient is in no acute distress.  No respiratory distress.  Heart regular.  Lungs clear.  Skin warm and dry.  I have supervised PA-C tenderly and administration of full migraine therapy including DHE and Decadron.  Patient has not had significant improvement.  Will consult neurology for status migrainosus.  CRITICAL CARE Performed by: Kiyo Heal PfeiffeArby Barretter   Total critical care time: 30  minutes  Critical care time was exclusive of separately billable procedures and treating other patients.  Critical care was necessary to treat or prevent imminent or life-threatening deterioration.  Critical care was time spent personally by me on the following activities: development of treatment plan with patient and/or surrogate as well as nursing, discussions with consultants, evaluation of patient's response to treatment, examination of patient, obtaining history from patient or surrogate, ordering and performing treatments and interventions, ordering and review of laboratory studies, ordering and review of radiographic studies, pulse oximetry and re-evaluation of patient's condition.   Arby BarrettePfeiffer, Desma Wilkowski, MD 01/07/18 1245

## 2018-01-05 NOTE — Consult Note (Addendum)
Neurology Consultation  Reason for Consult: Migraine headache Referring Physician: Ophelia Charter  CC: Headache  History is obtained from: Patient  HPI: Randy Lowe is a 42 y.o. male with past medical history of migraine, asthma and GERD.  Patient states on Sunday he was driving and noted that he had a left-sided headache that came on suddenly with some neck pain.  Over the course of a few days it has become worse.  Patient states that although it somewhat feels like his normal migraine with the light sensitivity and sound sensitivity it is much stronger than usual and is constant.  He rates it as a 10/10.  States that the headache is mostly in the left occipital, parietal, frontal and some portions of the left side of his face with photophobia and phonophobia.  He also notes that he has some left neck pain.  While in the ED he has received Decadron 10 mg once, migraine cocktail considering of Toradol-Benadryl-Reglan, he has received Ativan,  1 dose of 1 mg DHE, along with magnesium.  Patient states her normal migraine for him has photophobia phonophobia and usually located over the left frontal region and sometimes over his left eye.  He used to use Imitrex however the last headache that did not work thus he was prescribed indomethacin and Toradol.  Currently he is in the hallway, sitting up at times looking at his phone in the setting of bright lights showing no discomfort.  But still states he has a 10/10 headache.  LKW: 01/03/2018  Outpatient treatment: On October 9 of 2019 patient was consulted by [facility], for increasing frequency and severity of headaches.  That time he stated to those caretakers that his headache is always there but the intensity does vary.  At that time he rated his headache an 8/10.  Again the location was on left side of the head and either in the occipital region or around the temporal region.  At that time he describes stabbing and sharp pain at  onset then after medication would go to a dull ache.  He has stated that he had tried Motrin, Benadryl, naproxen, and Tylenol with no relief.  He has been given Imitrex and Maxalt for abortive therapy but this only provided brief relief.  At that time he did have an MRI of the brain with and without contrast.  It was read as normal appearance on all pulse sequences without evidence of atrophy, stroke, mass, hydrocephalus and no abnormal enhancements.  Patient was prescribed Elavil 25 mg that he will take at night.  He was also provided a prescription of indomethacin 75 mg to be taken at the onset of new migraine headaches.  ROS: A 14 point ROS was performed and is negative except as noted in the HPI.   Past Medical History:  Diagnosis Date  . Asthma   . GERD (gastroesophageal reflux disease)   . Migraine      Family History  Problem Relation Age of Onset  . CAD Father   . Colon cancer Father   . CVA Maternal Grandmother   . CAD Maternal Grandmother   . CAD Maternal Grandfather   . Migraines Neg Hx      Social History:   reports that he has never smoked. He has never used smokeless tobacco. He reports that he does not drink alcohol or use drugs.  Medications  Current Facility-Administered Medications:  .  acetaminophen (TYLENOL) tablet 650 mg, 650 mg, Oral, Q6H PRN **  OR** acetaminophen (TYLENOL) suppository 650 mg, 650 mg, Rectal, Q6H PRN, Jonah BlueYates, Jennifer, MD .  amitriptyline (ELAVIL) tablet 25 mg, 25 mg, Oral, QHS, Jonah BlueYates, Jennifer, MD .  ondansetron [facility](ZOFRAN) tablet 4 mg, 4 mg, Oral, Q6H PRN **OR** ondansetron (ZOFRAN) injection 4 mg, 4 mg, Intravenous, Q6H PRN, Jonah BlueYates, Jennifer, MD  Current Outpatient Medications:  .  amitriptyline (ELAVIL) 25 MG tablet, Take 25 mg by mouth at bedtime., Disp: , Rfl:  .  aspirin-acetaminophen-caffeine (EXCEDRIN MIGRAINE) 250-250-65 MG tablet, Take 1 tablet by mouth every 6 (six) hours as needed for headache., Disp: , Rfl:  .  ibuprofen  (ADVIL,MOTRIN) 200 MG tablet, Take 800 mg by mouth every 6 (six) hours as needed., Disp: , Rfl:  .  indomethacin (INDOCIN SR) 75 MG CR capsule, Take 75 mg by mouth as directed. Take 1 capsule at onset of migraine, may repeat after 12 hours if no relief., Disp: , Rfl:  .  ondansetron (ZOFRAN-ODT) 4 MG disintegrating tablet, Take 4 mg by mouth every 6 (six) hours as needed for nausea/vomiting., Disp: , Rfl:  .  traMADol (ULTRAM) 50 MG tablet, Take 1 tablet (50 mg total) by mouth every 6 (six) hours as needed. (Patient not taking: Reported on 01/05/2018), Disp: 15 tablet, Rfl: 0   Exam: Current vital signs: BP 137/89 (BP Location: Left Arm)   Pulse 88   Temp 98.6 F (37 C) (Oral)   Resp 20   Ht 6\' 2"  (1.88 m)   Wt 131.5 kg   SpO2 99%   BMI 37.23 kg/m  Vital signs in last 24 hours: Temp:  [98 F (36.7 C)-98.6 F (37 C)] 98.6 F (37 C) (11/26 0802) Pulse Rate:  [79-97] 88 (11/26 1349) Resp:  [16-20] 20 (11/26 1349) BP: (137-162)/(89-97) 137/89 (11/26 1349) SpO2:  [99 %-100 %] 99 % (11/26 1349) Weight:  [131.5 kg] 131.5 kg (11/26 0542)  Physical Exam  Constitutional: Appears well-developed and well-nourished.  Psych: At this point patient states he is a 10/10 headache however he does not appear to be in any severe pain, able to tolerate bright lights and sitting comfortably looking at his phone. Eyes: No scleral injection HENT: No OP obstrucion Head: Normocephalic.  Cardiovascular: Normal rate and regular rhythm.  Respiratory: Effort normal, non-labored breathing GI: Soft.  No distension. There is no tenderness.  Skin: WDI  Neuro: Mental Status: Patient is awake, alert, oriented to person, place, month, year, and situation. Patient is able to give a clear and coherent history. No signs of aphasia or neglect Cranial Nerves: II: Visual Fields are full. Pupils are equal, round, and reactive to light.   III,IV, VI: EOMI without ptosis or diploplia.  V: Facial sensation is  symmetric to temperature VII: Facial movement is symmetric.  VIII: hearing is intact to voice X: Uvula elevates symmetrically XI: Shoulder shrug is symmetric. XII: tongue is midline without atrophy or fasciculations.  Motor: Tone is normal. Bulk is normal. 5/5 strength was present in all four extremities.  Of note when testing the left arm patient had to give way weakness with bicep flexion, tricep extension and shoulder abduction. Sensory: Sensation is symmetric to light touch and temperature in the arms and legs. Deep Tendon Reflexes: 2+ and symmetric in the biceps and patellae.  Plantars: Toes are downgoing bilaterally.  Cerebellar: FNF and HKS are intact bilaterally  Labs I have reviewed labs in epic and the results pertinent to this consultation are:   CBC    Component Value Date/Time  HGB 18.0 (H) 08/19/2006 2303   HCT 53.0 (H) 08/19/2006 2303    CMP     Component Value Date/Time   NA 140 08/19/2006 2303   K 4.1 08/19/2006 2303   CL 104 08/19/2006 2303   GLUCOSE 91 08/19/2006 2303   BUN 14 08/19/2006 2303   CREATININE 1.2 08/19/2006 2303    Lipid Panel  No results found for: CHOL, TRIG, HDL, CHOLHDL, VLDL, LDLCALC, LDLDIRECT   Imaging I have reviewed the images obtained:  MRI examination of the brain-normal noncontrast MRI appearance of the brain.  Felicie Morn PA-C [facility] [phone]  M-F  (9:00 am- 5:00 PM)  01/05/2018, 4:27 PM     Assessment:  42 year old male with history of migraine headaches who is been seen by [facility] in the past recently this year.  At that time he was prescribed Elavil and indomethacin.  Now arriving at [facility] secondary to progressively increased headache.  Patient has had multiple medication as noted above.  MRI is negative. Patient received DHE without much improvement. He does seem fairly comfortable though.   Status Migrainosus  Recommendations: Load with IV valproic acid  1500mg  Continue PRN zofran,reglan Continue Amitriptyline      NEUROHOSPITALIST ADDENDUM Performed a face to face diagnostic evaluation.   I have reviewed the contents of history and physical exam as documented by PA/ARNP/Resident and agree with above documentation.  I have discussed and formulated the above plan as documented. Edits to the note have been made as needed.  Throbbing headache with nausea/vomiting. Likely status migrainosus. MRI brain unremarkable.  Patient did not respond to migraine cocktail in ER. Also received DHE without much relief.  Will start patient on Valproic acid. Also PRN reglan. On exam, patient did not appear in asmuch distress and with lights on in the room.     Georgiana Spinner Vinny Taranto MD [facility] [phone]   If 7pm to 7am, please call on call as listed on AMION.

## 2018-01-06 ENCOUNTER — Encounter (HOSPITAL_COMMUNITY): Payer: Self-pay | Admitting: Internal Medicine

## 2018-01-06 DIAGNOSIS — G43901 Migraine, unspecified, not intractable, with status migrainosus: Secondary | ICD-10-CM | POA: Diagnosis not present

## 2018-01-06 LAB — HIV ANTIBODY (ROUTINE TESTING W REFLEX): HIV Screen 4th Generation wRfx: NONREACTIVE

## 2018-01-06 MED ORDER — DIHYDROERGOTAMINE MESYLATE 1 MG/ML IJ SOLN
1.0000 mg | Freq: Once | INTRAMUSCULAR | Status: AC
  Administered 2018-01-06: 1 mg via INTRAVENOUS
  Filled 2018-01-06: qty 1

## 2018-01-06 MED ORDER — DIPHENHYDRAMINE HCL 50 MG/ML IJ SOLN
12.5000 mg | Freq: Four times a day (QID) | INTRAMUSCULAR | Status: DC | PRN
  Administered 2018-01-06: 12.5 mg via INTRAVENOUS
  Filled 2018-01-06: qty 1

## 2018-01-06 MED ORDER — METOCLOPRAMIDE HCL 5 MG/ML IJ SOLN
10.0000 mg | Freq: Once | INTRAMUSCULAR | Status: AC
  Administered 2018-01-06: 10 mg via INTRAVENOUS
  Filled 2018-01-06: qty 2

## 2018-01-06 MED ORDER — VALPROATE SODIUM 500 MG/5ML IV SOLN
1500.0000 mg | Freq: Once | INTRAVENOUS | Status: AC
  Administered 2018-01-06: 1500 mg via INTRAVENOUS
  Filled 2018-01-06: qty 15

## 2018-01-06 MED ORDER — METOCLOPRAMIDE HCL 5 MG/5ML PO SOLN
10.0000 mg | Freq: Once | ORAL | Status: AC
  Administered 2018-01-06: 10 mg via ORAL
  Filled 2018-01-06 (×2): qty 10

## 2018-01-06 MED ORDER — DIPHENHYDRAMINE HCL 50 MG/ML IJ SOLN
25.0000 mg | Freq: Once | INTRAMUSCULAR | Status: AC
  Administered 2018-01-06: 25 mg via INTRAVENOUS
  Filled 2018-01-06: qty 1

## 2018-01-06 MED ORDER — MAGNESIUM SULFATE 2 GM/50ML IV SOLN
2.0000 g | Freq: Once | INTRAVENOUS | Status: AC
  Administered 2018-01-06: 2 g via INTRAVENOUS
  Filled 2018-01-06: qty 50

## 2018-01-06 MED ORDER — KETOROLAC TROMETHAMINE 30 MG/ML IJ SOLN
30.0000 mg | Freq: Once | INTRAMUSCULAR | Status: AC
  Administered 2018-01-06: 30 mg via INTRAVENOUS
  Filled 2018-01-06: qty 1

## 2018-01-06 MED ORDER — GABAPENTIN 300 MG PO CAPS: 300.0000 mg | ORAL_CAPSULE | Freq: Three times a day (TID) | ORAL | 0 refills | Status: DC

## 2018-01-06 MED ORDER — METOCLOPRAMIDE HCL 5 MG/ML IJ SOLN: 10.0000 mg | Freq: Four times a day (QID) | INTRAMUSCULAR | Status: DC | PRN

## 2018-01-06 NOTE — Discharge Summary (Signed)
Physician Discharge Summary  Randy Lowe ZOX:[id number] DOB: [birth year] DOA: 01/05/2018  PCP: Patient, No Pcp Per  Admit date: 01/05/2018 Discharge date: 01/06/2018  Time spent: 40 minutes  Recommendations for Outpatient Follow-up:  1. Follow up with primary neurologist 1-2 weeks for evaluation of headaches 2. Take medications as directed   Discharge Diagnoses:  Principal Problem:   Status migrainosus Active Problems:   ANEMIA, HX OF   Discharge Condition: stabel  Diet recommendation: carb modified  Filed Weights   01/05/18 0542 01/05/18 1651  Weight: 131.5 kg 131.5 kg    History of present illness:   Past medical hx includes migraine, asthma and gerd. Presented to ED 11/26 with cc 2 day hx headache. Located left side started suddenly. Associated symptoms include left neck pain mild tingly of left hand/led. Patient stated normal migraine for him has photophobia phonophobia and usually located over the left frontal region and sometimes over his left eye.  He used to use Imitrex however the last headache that did not work thus he was prescribed indomethacin and Toradol.  Hospital Course:  1.Status migrainosus. Hx of same. Seen by [facility]. Given elavil and indomethacin. MRI negative. Provided with valproic acid and elavil.  Evaluated by neuro who recommend toredol, reglan and depake. Improved slightly day of discharge. Per Neuro, one more dose toredol, depakote and reglan and discharge with gabapentin 300mg  TID for 3 days. Follow up with OP neuro  Recommendations:  Continue Amitriptyline F/u with outpatient neurology Levert Feinstein, FNP at [facility]   Procedures:    Consultations:  Dr Amada Jupiter  Discharge Exam: Vitals:   01/06/18 0559 01/06/18 0915  BP: (!) 141/94 (!) 142/96  Pulse: 82 91  Resp: 16 17  Temp: (!) 97.5 F (36.4 C) 97.6 F (36.4 C)  SpO2: 98% 96%    General: sitting up in bed watching tv  Cardiovascular: rrr no MGR no  LEedema Respiratory: normal effort BS clear bilaterally. No wheeze or rhonchi  Discharge Instructions   Discharge Instructions    Call MD for:  difficulty breathing, headache or visual disturbances   Complete by:  As directed    Call MD for:  persistant dizziness or light-headedness   Complete by:  As directed    Diet - low sodium heart healthy   Complete by:  As directed    Discharge instructions   Complete by:  As directed    Take medications as directed Follow up with primary neurologist 1-2 weeks   Increase activity slowly   Complete by:  As directed      Allergies as of 01/06/2018      Reactions   Meloxicam Itching      Medication List    STOP taking these medications   traMADol 50 MG tablet Commonly known as:  ULTRAM     TAKE these medications   amitriptyline 25 MG tablet Commonly known as:  ELAVIL Take 25 mg by mouth at bedtime.   aspirin-acetaminophen-caffeine 250-250-65 MG tablet Commonly known as:  EXCEDRIN MIGRAINE Take 1 tablet by mouth every 6 (six) hours as needed for headache.   gabapentin 300 MG capsule Commonly known as:  NEURONTIN Take 1 capsule (300 mg total) by mouth 3 (three) times daily for 3 days.   ibuprofen 200 MG tablet Commonly known as:  ADVIL,MOTRIN Take 800 mg by mouth every 6 (six) hours as needed.   indomethacin 75 MG CR capsule Commonly known as:  INDOCIN SR Take 75 mg by mouth as directed. Take  1 capsule at onset of migraine, may repeat after 12 hours if no relief.   ondansetron 4 MG disintegrating tablet Commonly known as:  ZOFRAN-ODT Take 4 mg by mouth every 6 (six) hours as needed for nausea/vomiting.      Allergies  Allergen Reactions  . Meloxicam Itching      The results of significant diagnostics from this hospitalization (including imaging, microbiology, ancillary and laboratory) are listed below for reference.    Significant Diagnostic Studies: Mr Brain 68Wo Contrast  Result Date: 01/05/2018 CLINICAL  DATA:  42 year old male with worst headache of life. Photophobia. History of migraines. EXAM: MRI HEAD WITHOUT CONTRAST TECHNIQUE: Multiplanar, multiecho pulse sequences of the brain and surrounding structures were obtained without intravenous contrast. COMPARISON:  Skull radiographs 11/10/2007. FINDINGS: Brain: No restricted diffusion to suggest acute infarction. No midline shift, mass effect, evidence of mass lesion, ventriculomegaly, extra-axial collection or acute intracranial hemorrhage. Cervicomedullary junction and pituitary are within normal limits. Cerebral volume is within normal limits. Wallace CullensGray and white matter signal is within normal limits throughout the brain. No encephalomalacia or chronic cerebral blood products identified. The deep gray matter nuclei, brainstem, and cerebellum appear normal. Vascular: Major intracranial vascular flow voids are preserved, the distal right vertebral artery appears dominant. Skull and upper cervical spine: Negative visible cervical spine. Normal bone marrow signal. Sinuses/Orbits: Negative orbits. Trace paranasal sinus mucosal thickening. Other: Mastoid air cells are clear. Grossly normal visible internal auditory structures. Scalp and face soft tissues appear negative. IMPRESSION: Normal noncontrast MRI appearance of the brain. Electronically Signed   By: Odessa FlemingH  Hall M.D.   On: 01/05/2018 10:42    Microbiology: No results found for this or any previous visit (from the past 240 hour(s)).   Labs: Basic Metabolic Panel: Recent Labs  Lab 01/05/18 1954  NA 137  K 4.0  CL 107  CO2 21*  GLUCOSE 177*  BUN 10  CREATININE 1.35*  CALCIUM 9.0   Liver Function Tests: Recent Labs  Lab 01/05/18 1954  AST 31  ALT 27  ALKPHOS 65  BILITOT 0.3  PROT 7.2  ALBUMIN 3.7   No results for input(s): LIPASE, AMYLASE in the last 168 hours. No results for input(s): AMMONIA in the last 168 hours. CBC: Recent Labs  Lab 01/05/18 1954  WBC 6.2  NEUTROABS 4.7  HGB 13.5   HCT 46.5  MCV 65.6*  PLT 274   Cardiac Enzymes: No results for input(s): CKTOTAL, CKMB, CKMBINDEX, TROPONINI in the last 168 hours. BNP: BNP (last 3 results) No results for input(s): BNP in the last 8760 hours.  ProBNP (last 3 results) No results for input(s): PROBNP in the last 8760 hours.  CBG: No results for input(s): GLUCAP in the last 168 hours.     SignedGwenyth Bender:  Josiane Labine M NP  [facility] 01/06/2018, 1:41 PM

## 2018-01-06 NOTE — Progress Notes (Addendum)
NEURO HOSPITALIST PROGRESS NOTE   Subjective: Patient in bed, awake, alert, NAD. Still c/o HA pain 9/10. Says toradol helped take the edge off. Not able to eat much, but is able to drink.  Has had DHE in the past w/o relief but willing to try again. Pain level goal 4/10. Appears to be doing well. Reports that this migraine is the worst that he has had in awhile. Patient sees Levert FeinsteinJeanette Torain, FNP for out patient neurology care. Exam: Vitals:   01/05/18 2338 01/06/18 0559  BP: (!) 138/92 (!) 141/94  Pulse: 96 82  Resp: 18 16  Temp: 98.2 F (36.8 C) (!) 97.5 F (36.4 C)  SpO2: 96% 98%    Physical Exam   HEENT-  Normocephalic, no lesions, without obvious abnormality.  Normal external eye and conjunctiva.   Cardiovascular- S1-S2 audible, pulses palpable throughout   Lungs-no rhonchi or wheezing noted, no excessive working breathing.  Saturations within normal limits on RA Abdomen- round All 4 quadrants BS present Extremities- Warm, dry and intact Musculoskeletal-no joint tenderness, deformity or swelling Skin-warm and dry, no hyperpigmentation, vitiligo, or suspicious lesions   Neuro: Mental Status: Patient is awake, alert, oriented to person, place, month, year, and situation.Patient is able to give a clear and coherent history. No signs of aphasia or neglect Cranial Nerves: II: Visual Fields are full. Pupils are equal, round, and reactive to light.  some photophobia present III,IV, VI: EOMI without ptosis or diploplia.  V: Facial sensation is symmetric to temperature VII: Facial movement is symmetric.  VIII: hearing is intact to voice X: Uvula elevates symmetrically XI: Shoulder shrug is symmetric. XII: tongue is midline without atrophy or fasciculations.  Motor: Tone is normal. Bulk is normal. 5/5 strength was present in all four extremities.  still present on exam testing the left arm patient had to give way weakness with bicep flexion, tricep extension  and shoulder abduction. Sensory: Sensation is symmetric to light touch and temperature in the arms and legs. Deep Tendon Reflexes: 2+ and symmetric in the biceps and patellae.  Plantars: Toes are downgoing bilaterally.  Cerebellar: FNF and HKS are intact bilaterally    Medications:  Current Facility-Administered Medications  Medication Dose Route Frequency Provider Last Rate Last Dose  . acetaminophen (TYLENOL) tablet 650 mg  650 mg Oral Q6H PRN Jonah BlueYates, Jennifer, MD   650 mg at 01/06/18 16100644   Or  . acetaminophen (TYLENOL) suppository 650 mg  650 mg Rectal Q6H PRN Jonah BlueYates, Jennifer, MD      . amitriptyline (ELAVIL) tablet 25 mg  25 mg Oral Noemi ChapelQHS Yates, Jennifer, MD   25 mg at 01/05/18 2218  . diphenhydrAMINE (BENADRYL) injection 12.5 mg  12.5 mg Intravenous Q6H PRN Aroor, Dara LordsSushanth R, MD   12.5 mg at 01/06/18 0644  . metoCLOPramide (REGLAN) injection 10 mg  10 mg Intravenous Q6H PRN Aroor, Dara LordsSushanth R, MD      . ondansetron (ZOFRAN) tablet 4 mg  4 mg Oral Q6H PRN Jonah BlueYates, Jennifer, MD       Or  . ondansetron [facility](ZOFRAN) injection 4 mg  4 mg Intravenous Q6H PRN Jonah BlueYates, Jennifer, MD         Pertinent Labs/Diagnostics:   Mr Brain Wo Contrast  Result Date: 01/05/2018 CLINICAL DATA:  42 year old male with worst headache of life. Photophobia. History of migraines. EXAM: MRI HEAD WITHOUT CONTRAST TECHNIQUE: Multiplanar, multiecho pulse sequences  of the brain and surrounding structures were obtained without intravenous contrast. COMPARISON:  Skull radiographs 11/10/2007. FINDINGS: Brain: No restricted diffusion to suggest acute infarction. No midline shift, mass effect, evidence of mass lesion, ventriculomegaly, extra-axial collection or acute intracranial hemorrhage. Cervicomedullary junction and pituitary are within normal limits. Cerebral volume is within normal limits. Wallace Cullens and white matter signal is within normal limits throughout the brain. No encephalomalacia or chronic cerebral blood products  identified. The deep gray matter nuclei, brainstem, and cerebellum appear normal. Vascular: Major intracranial vascular flow voids are preserved, the distal right vertebral artery appears dominant. Skull and upper cervical spine: Negative visible cervical spine. Normal bone marrow signal. Sinuses/Orbits: Negative orbits. Trace paranasal sinus mucosal thickening. Other: Mastoid air cells are clear. Grossly normal visible internal auditory structures. Scalp and face soft tissues appear negative. IMPRESSION: Normal noncontrast MRI appearance of the brain. Electronically Signed   By: Odessa Fleming M.D.   On: 01/05/2018 10:42   Assessment:  42 year old male with history of migraine headaches who is been seen by [facility] in the past recently this year.  At that time he was prescribed Elavil and indomethacin.  Now arriving at [facility] secondary to progressively increased headache.  Patient has had multiple medication as noted above.  MRI is negative. Patient received DHE without much improvement. He does seem fairly comfortable though.   Impression:  Status migrainosus  Recommendations:  Continue Amitriptyline   F/u with outpatient neurology Levert Feinstein, FNP at [facility]  Valentina Lucks, MSN, NP-C [facility] [phone]  Attending neurologist's note to follow  01/06/2018, 9:04 AM  He reports that his headache is improved, though still with significant headache.  I think that a goal is not pain-free to him with his hospitalization but just reduction in the acute headache phase.  He has already received DHE yesterday, Depakote, Toradol, Reglan.  I would favor repeating a dose of DHE today as well as giving a dose of magnesium.  I have had some success with gabapentin 300 twice daily for 2 to 3 days for this type of headache at times.  Following his dose of DHE today, no further inpatient recommendations, I would favor doing gabapentin and having him follow-up  with his outpatient neurologist.  Ritta Slot, MD [facility] [phone]  If 7pm- 7am, please page neurology on call as listed in AMION.

## 2018-03-05 DIAGNOSIS — G250 Essential tremor: Secondary | ICD-10-CM | POA: Insufficient documentation

## 2018-10-12 ENCOUNTER — Other Ambulatory Visit: Payer: Self-pay | Admitting: Emergency Medicine

## 2018-10-12 DIAGNOSIS — Z20822 Contact with and (suspected) exposure to covid-19: Secondary | ICD-10-CM

## 2018-10-14 LAB — NOVEL CORONAVIRUS, NAA: SARS-CoV-2, NAA: NOT DETECTED

## 2018-11-15 ENCOUNTER — Other Ambulatory Visit: Payer: Self-pay | Admitting: *Deleted

## 2018-11-15 DIAGNOSIS — Z20822 Contact with and (suspected) exposure to covid-19: Secondary | ICD-10-CM

## 2018-11-17 LAB — NOVEL CORONAVIRUS, NAA: SARS-CoV-2, NAA: NOT DETECTED

## 2019-12-09 DIAGNOSIS — R002 Palpitations: Secondary | ICD-10-CM | POA: Insufficient documentation

## 2019-12-13 ENCOUNTER — Other Ambulatory Visit: Payer: Self-pay

## 2019-12-13 ENCOUNTER — Ambulatory Visit
Admission: RE | Admit: 2019-12-13 | Discharge: 2019-12-13 | Disposition: A | Discharge status: Home / Self Care | Payer: BC Managed Care – PPO | Source: Ambulatory Visit | Attending: Urgent Care | Admitting: Urgent Care

## 2019-12-13 ENCOUNTER — Other Ambulatory Visit: Payer: Self-pay | Admitting: Urgent Care

## 2019-12-13 DIAGNOSIS — R0602 Shortness of breath: Secondary | ICD-10-CM

## 2019-12-14 ENCOUNTER — Ambulatory Visit
Admission: RE | Admit: 2019-12-14 | Discharge: 2019-12-14 | Disposition: A | Discharge status: Home / Self Care | Payer: BC Managed Care – PPO | Source: Ambulatory Visit | Attending: Urgent Care | Admitting: Urgent Care

## 2019-12-22 ENCOUNTER — Ambulatory Visit: Payer: BC Managed Care – PPO | Admitting: Cardiology

## 2019-12-22 ENCOUNTER — Encounter: Payer: Self-pay | Admitting: Cardiology

## 2019-12-22 ENCOUNTER — Other Ambulatory Visit: Payer: Self-pay | Admitting: Urgent Care

## 2019-12-22 ENCOUNTER — Other Ambulatory Visit: Payer: Self-pay

## 2019-12-22 VITALS — BP 133/99 | HR 80 | Ht 74.0 in | Wt 328.0 lb

## 2019-12-22 DIAGNOSIS — R0600 Dyspnea, unspecified: Secondary | ICD-10-CM

## 2019-12-22 DIAGNOSIS — R002 Palpitations: Secondary | ICD-10-CM

## 2019-12-22 DIAGNOSIS — R0609 Other forms of dyspnea: Secondary | ICD-10-CM

## 2019-12-22 MED ORDER — FUROSEMIDE 20 MG PO TABS: 20.0000 mg | ORAL_TABLET | Freq: Every day | ORAL | 3 refills | Status: AC

## 2019-12-22 NOTE — Progress Notes (Signed)
Patient referred by Maretta Bees, PA for palpitations  Subjective:   Randy Lowe, male    DOB: 1975-04-01, 44 y.o.   MRN: [id number]   Chief Complaint  Patient presents with 44  . Palpitations  . New Patient (Initial Visit)  . Shortness of Breath  . Fatigue     HPI  44 y.o. African-American male with palpitations, shortness of breath  Patient is an Scientist, product/process development, has a Health and safety inspector job.  He does not do any regular physical exercise.  He has underlying asthma.  For the last few weeks, he has had dry cough and shortness of breath.  He has also had pleuritic chest pain.  He was diagnosed as PCP and treated with steroids as well as antibiotics.  However, his symptoms of exertional dyspnea pleuritic chest pain have continued.  He also reports 3 pillow orthopnea.  He reports occasional leg edema.   He has family h/o heart failure, but not at his age. He does not smoke, drink, or use any recreational drugs.   Past Medical History:  Diagnosis Date  . Asthma   . GERD (gastroesophageal reflux disease)   . Migraine      Past Surgical History:  Procedure Laterality Date  . APPENDECTOMY    . CHOLECYSTECTOMY    . TONSILLECTOMY       Social History   Tobacco Use  Smoking Status Never Smoker  Smokeless Tobacco Never Used    Social History   Substance and Sexual Activity  Alcohol Use No     Family History  Problem Relation Age of Onset  . CAD Father   . Colon cancer Father   . CVA Maternal Grandmother   . CAD Maternal Grandmother   . CAD Maternal Grandfather   . Migraines Neg Hx      Current Outpatient Medications on File Prior to Visit  Medication Sig Dispense Refill  . omeprazole (PRILOSEC) 10 MG capsule Take 10 mg by mouth daily.    Marland Kitchen albuterol (VENTOLIN HFA) 108 (90 Base) MCG/ACT inhaler Inhale 1-2 puffs into the lungs as needed.    Marland Kitchen aspirin-acetaminophen-caffeine (EXCEDRIN MIGRAINE) 250-250-65 MG tablet Take 1 tablet by mouth every 6 (six) hours as needed  for headache.    . gabapentin (NEURONTIN) 300 MG capsule Take 1 capsule (300 mg total) by mouth 3 (three) times daily for 3 days. 9 capsule 0  . ibuprofen (ADVIL,MOTRIN) 200 MG tablet Take 800 mg by mouth every 6 (six) hours as needed.    . indomethacin (INDOCIN SR) 75 MG CR capsule Take 75 mg by mouth as directed. Take 1 capsule at onset of migraine, may repeat after 12 hours if no relief.    . ondansetron (ZOFRAN-ODT) 4 MG disintegrating tablet Take 4 mg by mouth every 6 (six) hours as needed for nausea/vomiting.    Marland Kitchen SILDENAFIL CITRATE PO Take 30 mg by mouth as needed.     No current facility-administered medications on file prior to visit.    Cardiovascular and other pertinent studies:  EKG 12/22/2019: Sinus tachycardia 103 bpm   Occasional PAC    Nonspecific ST-T changes   Recent labs: Not available    Review of Systems  Cardiovascular: Positive for chest pain, dyspnea on exertion and palpitations. Negative for leg swelling and syncope.         Vitals:   12/22/19 1410  BP: (!) 133/99  Pulse: 80  SpO2: 100%     There is no height or weight on  file to calculate BMI. Filed Weights   12/22/19 1410  Weight: (!) 328 lb (148.8 kg)     Objective:   Physical Exam Vitals and nursing note reviewed.  Constitutional:      General: He is not in acute distress. Neck:     Vascular: No JVD.  Cardiovascular:     Rate and Rhythm: Regular rhythm. Tachycardia present.     Heart sounds: No murmur heard.  Gallop present.   Pulmonary:     Effort: Pulmonary effort is normal.     Breath sounds: Normal breath sounds. No wheezing or rales.          Assessment & Recommendations:   44 y.o. 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  EKG today shows sinus tachycardia.  EKG performed by PCP shows sinus arrhythmia with frequent PACs, but no other  significant abnormalities.  Check BMP, BNP, echocardiogram. Will try lasix 20 mg daily.   Further recommendations after above testing.   Thank you for referring the patient to [place]. Please feel free to contact with any questions.   [name], MD Pager: [phone] Office: [phone]

## 2019-12-23 ENCOUNTER — Ambulatory Visit
Admission: RE | Admit: 2019-12-23 | Discharge: 2019-12-23 | Disposition: A | Discharge status: Home / Self Care | Payer: BC Managed Care – PPO | Source: Ambulatory Visit | Attending: Urgent Care | Admitting: Urgent Care

## 2019-12-23 DIAGNOSIS — R0600 Dyspnea, unspecified: Secondary | ICD-10-CM

## 2019-12-23 MED ORDER — IOPAMIDOL (ISOVUE-370) INJECTION 76%
75.0000 mL | Freq: Once | INTRAVENOUS | Status: AC | PRN
  Administered 2019-12-23: 75 mL via INTRAVENOUS

## 2019-12-26 ENCOUNTER — Other Ambulatory Visit: Payer: Self-pay

## 2019-12-26 ENCOUNTER — Ambulatory Visit: Payer: BC Managed Care – PPO

## 2019-12-26 DIAGNOSIS — R0609 Other forms of dyspnea: Secondary | ICD-10-CM

## 2019-12-26 DIAGNOSIS — R002 Palpitations: Secondary | ICD-10-CM

## 2019-12-26 DIAGNOSIS — R0600 Dyspnea, unspecified: Secondary | ICD-10-CM

## 2020-01-04 ENCOUNTER — Ambulatory Visit: Payer: BC Managed Care – PPO | Admitting: Cardiology

## 2020-01-08 NOTE — Progress Notes (Signed)
Patient referred by Maretta Bees, PA for palpitations  Subjective:   Randy Lowe, male    DOB: 1975/12/28, 44 y.o.   MRN: [id number]   Chief Complaint  Patient presents with  . Shortness of Breath     HPI  44 y.o. African-American male with palpitations, shortness of breath  Patient has had some improvement in her shortness of breath with Lasix, but continues to have class III dyspnea.  Echocardiogram does not show any severe abnormalities, reduced flow.  Blood pressure is elevated today.  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  He has underlying asthma.  For the last few weeks, he has had dry cough and shortness of breath.  He has also had pleuritic chest pain.  He was diagnosed as PCP and treated with steroids as well as antibiotics.  However, his symptoms of exertional dyspnea pleuritic chest pain have continued.  He also reports 3 pillow orthopnea.  He reports occasional leg edema.   He has family h/o heart failure, but not at his age. He does not smoke, drink, or use any recreational drugs.     Current Outpatient Medications on File Prior to Visit  Medication Sig Dispense Refill  . albuterol (VENTOLIN HFA) 108 (90 Base) MCG/ACT inhaler Inhale 1-2 puffs into the lungs as needed.    . furosemide (LASIX) 20 MG tablet Take 1 tablet (20 mg total) by mouth daily. 30 tablet 3  . omeprazole (PRILOSEC) 10 MG capsule Take 10 mg by mouth daily.     No current facility-administered medications on file prior to visit.    Cardiovascular and other pertinent studies:  Echocardiogram 12/26/2019:  Left ventricle cavity is normal in size and wall thickness. Normal global  wall motion. Normal LV systolic function with EF 68%. Normal diastolic  filling pattern.  Mild tricuspid regurgitation. Peak gradient 13 mmHg.  IVC not visualized.   EKG 12/22/2019: Sinus tachycardia 103 bpm    Occasional PAC    Nonspecific ST-T changes   Recent labs: Not available    Review of Systems  Cardiovascular: Positive for chest pain, dyspnea on exertion and palpitations. Negative for leg swelling and syncope.         Vitals:   01/09/20 1029  BP: (!) 149/69  Pulse: 85  Resp: 16  SpO2: 98%     Body mass index is 40.19 kg/m. Filed Weights   01/09/20 1029  Weight: (!) 313 lb (142 kg)     Objective:   Physical Exam Vitals and nursing note reviewed.  Constitutional:      General: He is not in acute distress. Neck:     Vascular: No JVD.  Cardiovascular:     Rate and Rhythm: Regular rhythm. Tachycardia present.     Heart sounds: No murmur heard.  Gallop present.   Pulmonary:     Effort: Pulmonary effort is normal.     Breath sounds: Normal breath sounds. No wheezing or rales.          Assessment & Recommendations:   44 y.o. African-American male with palpitations, shortness of breath  Exertional dyspnea: Unremarkable echocardiogram, details above. Angina/anginal equivalent or hypertension are likely differentials. Recommend exercise nuclear stress test. Added metoprolol succinate 50 mg daily, hold on the day of stress test. Continue lasix 20 mg daily.  Check BMP, BNP  F/u after stress testing.     [name], MD  Pager: [phone] Office: [phone]

## 2020-01-09 ENCOUNTER — Other Ambulatory Visit (HOSPITAL_COMMUNITY)
Admission: RE | Admit: 2020-01-09 | Discharge: 2020-01-09 | Disposition: A | Discharge status: Home / Self Care | Payer: BC Managed Care – PPO | Source: Ambulatory Visit | Attending: Cardiology | Admitting: Cardiology

## 2020-01-09 ENCOUNTER — Encounter: Payer: Self-pay | Admitting: Cardiology

## 2020-01-09 ENCOUNTER — Ambulatory Visit: Payer: BC Managed Care – PPO | Admitting: Cardiology

## 2020-01-09 ENCOUNTER — Other Ambulatory Visit: Payer: Self-pay

## 2020-01-09 VITALS — BP 149/69 | HR 85 | Resp 16 | Ht 74.0 in | Wt 313.0 lb

## 2020-01-09 DIAGNOSIS — Z20822 Contact with and (suspected) exposure to covid-19: Secondary | ICD-10-CM | POA: Insufficient documentation

## 2020-01-09 DIAGNOSIS — R002 Palpitations: Secondary | ICD-10-CM

## 2020-01-09 DIAGNOSIS — I10 Essential (primary) hypertension: Secondary | ICD-10-CM

## 2020-01-09 DIAGNOSIS — Z01812 Encounter for preprocedural laboratory examination: Secondary | ICD-10-CM | POA: Insufficient documentation

## 2020-01-09 DIAGNOSIS — R0609 Other forms of dyspnea: Secondary | ICD-10-CM

## 2020-01-09 LAB — SARS CORONAVIRUS 2 (TAT 6-24 HRS): SARS Coronavirus 2: NEGATIVE

## 2020-01-09 MED ORDER — METOPROLOL SUCCINATE ER 50 MG PO TB24: 50.0000 mg | ORAL_TABLET | Freq: Every day | ORAL | 2 refills | Status: DC

## 2020-01-11 ENCOUNTER — Ambulatory Visit: Payer: BC Managed Care – PPO

## 2020-01-11 ENCOUNTER — Other Ambulatory Visit: Payer: Self-pay

## 2020-01-11 DIAGNOSIS — R0600 Dyspnea, unspecified: Secondary | ICD-10-CM

## 2020-01-11 DIAGNOSIS — R0609 Other forms of dyspnea: Secondary | ICD-10-CM

## 2020-01-17 ENCOUNTER — Ambulatory Visit: Payer: BC Managed Care – PPO | Admitting: Cardiology

## 2020-01-20 ENCOUNTER — Other Ambulatory Visit: Payer: Self-pay

## 2020-01-20 ENCOUNTER — Ambulatory Visit: Payer: BC Managed Care – PPO | Admitting: Cardiology

## 2020-01-20 ENCOUNTER — Encounter: Payer: Self-pay | Admitting: Cardiology

## 2020-01-20 VITALS — BP 146/87 | HR 74 | Resp 16 | Ht 74.0 in | Wt 334.4 lb

## 2020-01-20 DIAGNOSIS — I10 Essential (primary) hypertension: Secondary | ICD-10-CM

## 2020-01-20 DIAGNOSIS — R0600 Dyspnea, unspecified: Secondary | ICD-10-CM

## 2020-01-20 DIAGNOSIS — R0609 Other forms of dyspnea: Secondary | ICD-10-CM

## 2020-01-20 MED ORDER — AMLODIPINE BESYLATE 5 MG PO TABS: 5.0000 mg | ORAL_TABLET | Freq: Every day | ORAL | 3 refills | Status: DC

## 2020-01-20 NOTE — Progress Notes (Signed)
Patient referred by No ref. provider found for palpitations  Subjective:   Randy Lowe, male    DOB: 10/10/1975, 44 y.o.   MRN: [id number]   Chief Complaint  Patient presents with  . Results    Nuclear  . Follow-up     HPI  44 y.o. African-American male with palpitations, shortness of breath  He continues to have exertional dyspnea symptoms.  Blood pressure remains elevated.  Stress test results below.   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  He has underlying asthma.  For the last few weeks, he has had dry cough and shortness of breath.  He has also had pleuritic chest pain.  He was diagnosed as PCP and treated with steroids as well as antibiotics.  However, his symptoms of exertional dyspnea pleuritic chest pain have continued.  He also reports 3 pillow orthopnea.  He reports occasional leg edema.   He has family h/o heart failure, but not at his age. He does not smoke, drink, or use any recreational drugs.     Current Outpatient Medications on File Prior to Visit  Medication Sig Dispense Refill  . albuterol (VENTOLIN HFA) 108 (90 Base) MCG/ACT inhaler Inhale 1-2 puffs into the lungs as needed.    . furosemide (LASIX) 20 MG tablet Take 1 tablet (20 mg total) by mouth daily. 30 tablet 3  . metoprolol succinate (TOPROL-XL) 50 MG 24 hr tablet Take 1 tablet (50 mg total) by mouth daily. Take with or immediately following a meal. 30 tablet 2  . omeprazole (PRILOSEC) 10 MG capsule Take 10 mg by mouth daily.     No current facility-administered medications on file prior to visit.    Cardiovascular and other pertinent studies:  Lexiscan (Walking with Camelia Phenes) Sestamibi Stress Test 01/11/2020: Nondiagnostic ECG stress due to pharmacologic stress. Myocardial perfusion is normal. Overall LV systolic function is normal without regional wall motion abnormalities. Stress LV EF: 51%.   No previous exam available for comparison. Low risk.    Echocardiogram 12/26/2019:  Left ventricle cavity is normal in size and wall thickness. Normal global  wall motion. Normal LV systolic function with EF 68%. Normal diastolic  filling pattern.  Mild tricuspid regurgitation. Peak gradient 13 mmHg.  IVC not visualized.   EKG 12/22/2019: Sinus tachycardia 103 bpm   Occasional PAC    Nonspecific ST-T changes   Recent labs: Not available    Review of Systems  Cardiovascular: Positive for chest pain, dyspnea on exertion and palpitations. Negative for leg swelling and syncope.         Vitals:   01/20/20 1100  BP: (!) 146/87  Pulse: 74  Resp: 16  SpO2: 97%     Body mass index is 42.93 kg/m. Filed Weights   01/20/20 1100  Weight: (!) 334 lb 6.4 oz (151.7 kg)     Objective:   Physical Exam Vitals and nursing note reviewed.  Constitutional:      General: He is not in acute distress. Neck:     Vascular: No JVD.  Cardiovascular:     Rate and Rhythm: Regular rhythm. Tachycardia present.     Heart sounds: No murmur heard. Gallop present.   Pulmonary:     Effort: Pulmonary effort is normal.     Breath sounds: Normal breath sounds. No wheezing or rales.          Assessment & Recommendations:  44 y.o. African-American male with palpitations, shortness of breath  Exertional dyspnea: Unremarkable echocardiogram.  No ischemia on stress testing. Uncontrolled hypertension, deconditioning, and obesity could be contributing. Added amlodipine 5 mg daily, in addition to metoprolol succinate 50 mg daily. Referred to pulmonology for exertional dyspnea.  Follow-up in 3 months    [name], MD Pager: [phone] Office: [phone]

## 2020-04-19 ENCOUNTER — Ambulatory Visit: Payer: BC Managed Care – PPO | Admitting: Cardiology

## 2020-04-19 NOTE — Progress Notes (Signed)
No show

## 2020-05-05 ENCOUNTER — Other Ambulatory Visit: Payer: Self-pay | Admitting: Cardiology

## 2020-05-05 DIAGNOSIS — I10 Essential (primary) hypertension: Secondary | ICD-10-CM

## 2020-05-11 NOTE — Telephone Encounter (Signed)
Please contact patient about rescheduling

## 2020-05-14 ENCOUNTER — Ambulatory Visit: Payer: BC Managed Care – PPO | Admitting: Cardiology

## 2020-06-04 ENCOUNTER — Other Ambulatory Visit: Payer: Self-pay | Admitting: Cardiology

## 2020-06-04 DIAGNOSIS — I10 Essential (primary) hypertension: Secondary | ICD-10-CM

## 2020-06-26 ENCOUNTER — Telehealth: Payer: Self-pay | Admitting: Physician Assistant

## 2020-06-26 NOTE — Telephone Encounter (Signed)
Received a new hem referral from [facility], [place] for - other abnormality of red blood cells. Randy Lowe has been cld and scheduled to see [name] on 5/19 at 1pm w/labs at 1:30pm. Pt aware to arrive 20 minutes early.

## 2020-06-27 ENCOUNTER — Other Ambulatory Visit: Payer: Self-pay | Admitting: Physician Assistant

## 2020-06-27 DIAGNOSIS — Z862 Personal history of diseases of the blood and blood-forming organs and certain disorders involving the immune mechanism: Secondary | ICD-10-CM

## 2020-06-28 ENCOUNTER — Inpatient Hospital Stay: Payer: BC Managed Care – PPO

## 2020-06-28 ENCOUNTER — Inpatient Hospital Stay: Payer: BC Managed Care – PPO | Attending: Physician Assistant | Admitting: Physician Assistant

## 2020-06-28 ENCOUNTER — Other Ambulatory Visit: Payer: Self-pay

## 2020-07-29 ENCOUNTER — Other Ambulatory Visit: Payer: Self-pay | Admitting: Cardiology

## 2020-07-29 DIAGNOSIS — I10 Essential (primary) hypertension: Secondary | ICD-10-CM

## 2020-11-23 ENCOUNTER — Ambulatory Visit: Payer: BC Managed Care – PPO | Admitting: Rheumatology

## 2020-12-14 ENCOUNTER — Ambulatory Visit: Payer: BC Managed Care – PPO | Admitting: Rheumatology

## 2020-12-14 NOTE — Progress Notes (Addendum)
Office Visit Note  Patient: Randy Lowe             Date of Birth: January 21, 1976           MRN: [id number]             PCP: Chaney Malling, PA Referring: Chaney Malling, [place] Visit Date: 12/28/2020 Occupation: _0 @  Subjective:  New Patient (Initial Visit) (Joint and muscular pain, fatigue, abnormal labs)   History of Present Illness: Randy Lowe is a 45 y.o. male seen in consultation per request of his PCP.  According to the patient his symptoms a started at age 70 with episodic increased pain in his abdominal region and extremities.  He states the pain was severe and he was told that they were growing pains and will resolve.  The pain never did resolve.  He states that he was initially having flares about 2-3 times per year lasting for few days but gradually the attacks became more frequent and for longer duration.  He states he is almost in constant pain.  He also has history of chronic insomnia.  He states the pain is mostly in his arms, legs and in his back.  He notices swelling in his knee joints in his hands for the last 2 to 3 months.  He states the pain is more intense when the weather is cold and gets better in the warmer temperatures.  Recently has been experiencing pain almost every week or every other week and the pain is moderate to severe lasting for about 1 week.  He denies any muscle weakness although he experiences muscle pain.  He misses about 2 to 3 days/month at work due to severity of the pain.  He has history of migraines for about 22 years.  He states the migraines have become less frequent now.  There is no history of oral ulcers, nasal ulcers, dry eyes, malar rash, Raynaud's phenomenon, rash, lymphadenopathy.  He gives history of fatigue and dry mouth.  He had labs done by his PCP and May 2022 at that time ANA and Ro antibodies were positive.  All other autoimmune work-up was negative.  He was placed on prednisone 5 mg p.o. daily which she has been taking since  then.  Patient states that helped with his asthma but did not help with the joint pain or muscle pain.  There is family history of rheumatoid arthritis and lupus in his maternal aunts.  He has siblings and 4 children .  Activities of Daily Living:  Patient reports morning stiffness for 15 minutes.   Patient Reports nocturnal pain.  Difficulty dressing/grooming: Reports Difficulty climbing stairs: Reports Difficulty getting out of chair: Reports Difficulty using hands for taps, buttons, cutlery, and/or writing: Reports  Review of Systems  Constitutional:  Positive for fatigue.  HENT:  Positive for mouth dryness.   Eyes:  Negative for dryness.  Respiratory:  Positive for shortness of breath.        Asthma  Cardiovascular:  Negative for swelling in legs/feet.  Gastrointestinal:  Negative for constipation.  Endocrine: Positive for cold intolerance and heat intolerance.  Genitourinary:  Negative for difficulty urinating.  Musculoskeletal:  Positive for joint pain, joint pain, joint swelling, myalgias, morning stiffness, muscle tenderness and myalgias. Negative for muscle weakness.  Skin:  Negative for color change, rash and sensitivity to sunlight.  Allergic/Immunologic: Positive for susceptible to infections.  Neurological:  Positive for numbness and weakness.  Hematological:  Negative for bruising/bleeding tendency  and swollen glands.  Psychiatric/Behavioral:  Positive for sleep disturbance. Negative for depressed mood. The patient is not nervous/anxious.    PMFS History:  Patient Active Problem List   Diagnosis Date Noted   Essential hypertension 01/09/2020   Exertional dyspnea 12/22/2019   Palpitations 12/09/2019   Essential tremor 03/05/2018   Chronic migraine 11/16/2017   Asthma 07/21/2016   HYPERLIPIDEMIA 07/12/2008   Gastroesophageal reflux disease 07/12/2008   RECTAL BLEEDING 07/12/2008   ABDOMINAL PAIN, UNSPECIFIED SITE 07/12/2008   ANEMIA, HX OF 07/12/2008    Past  Medical History:  Diagnosis Date   Asthma    GERD (gastroesophageal reflux disease)    Migraine     Family History  Problem Relation Age of Onset   CAD Father    Colon cancer Father    Heart attack Father    CVA Maternal Grandmother    CAD Maternal Grandmother    CAD Maternal Grandfather    Congestive Heart Failure Maternal Grandfather    Hypertension Mother    Breast cancer Mother    Hypertension Sister    Migraines Neg Hx    Past Surgical History:  Procedure Laterality Date   APPENDECTOMY     CHOLECYSTECTOMY     TONSILLECTOMY     Social History   Social History Narrative   Not on file    There is no immunization history on file for this patient.   Objective: Vital Signs: BP (!) 165/92 (BP Location: Right Arm, Patient Position: Sitting, Cuff Size: Normal)   Pulse 97   Resp 16   Ht _0  (1.854 m)   Wt (!) 326 lb (147.9 kg)   BMI 43.01 kg/m    Physical Exam Vitals and nursing note reviewed.  Constitutional:      Appearance: He is well-developed.  HENT:     Head: Normocephalic and atraumatic.  Eyes:     Conjunctiva/sclera: Conjunctivae normal.     Pupils: Pupils are equal, round, and reactive to light.  Cardiovascular:     Rate and Rhythm: Normal rate and regular rhythm.     Heart sounds: Normal heart sounds.  Pulmonary:     Effort: Pulmonary effort is normal.     Breath sounds: Normal breath sounds.  Abdominal:     General: Bowel sounds are normal.     Palpations: Abdomen is soft.  Musculoskeletal:     Cervical back: Normal range of motion and neck supple.  Skin:    General: Skin is warm and dry.     Capillary Refill: Capillary refill takes less than 2 seconds.  Neurological:     Mental Status: He is alert and oriented to person, place, and time.  Psychiatric:        Behavior: Behavior normal.     Musculoskeletal Exam: C-spine was in good range of motion.  Thoracic and lumbar spine were in good range of motion.  He had no difficulty reaching his  toes.  There was no SI joint tenderness.  He had painful range of motion of his right shoulder joint with limited abduction and internal rotation.  Left shoulder joint was in full range of motion.  Elbow joints and wrist joints in good range of motion.  He described tenderness over PIP joints but no synovitis was noted.  Hip joints and knee joints with good range of motion without any warmth swelling or effusion.  He complains of discomfort in his right knee joint.  There was no tenderness over ankles or MTPs.  He had bilateral pes planus.  CDAI Exam: CDAI Score: -- Patient Global: --; Provider Global: -- Swollen: --; Tender: -- Joint Exam 12/28/2020   No joint exam has been documented for this visit   There is currently no information documented on the homunculus. Go to the Rheumatology activity and complete the homunculus joint exam.  Investigation: No additional findings.  Imaging: No results found.  Recent Labs: Lab Results  Component Value Date   WBC 6.2 01/05/2018   HGB 13.5 01/05/2018   PLT 274 01/05/2018   NA 137 01/05/2018   K 4.0 01/05/2018   CL 107 01/05/2018   CO2 21 (L) 01/05/2018   GLUCOSE 177 (H) 01/05/2018   BUN 10 01/05/2018   CREATININE 1.35 (H) 01/05/2018   BILITOT 0.3 01/05/2018   ALKPHOS 65 01/05/2018   AST 31 01/05/2018   ALT 27 01/05/2018   PROT 7.2 01/05/2018   ALBUMIN 3.7 01/05/2018   CALCIUM 9.0 01/05/2018   GFRAA >60 01/05/2018    Speciality Comments: No specialty comments available.  Procedures:  No procedures performed Allergies: Meloxicam   Assessment / Plan:     Visit Diagnoses: Polyarthralgia-he complains of pain and discomfort in multiple joints since he was 44 years old.  The symptoms are progressively getting worse.  Positive ANA (antinuclear antibody) - 06/30/20: ANA+, CRP 15.3, dsDNA<1, SM ab<1, SM/RNP-, RNP-, chromatin ab<1, Ro 1.2, La<1, Scl-70<1, Jo-1 Ab<1, ESR 2 -there is no ANA titer given.  SSA antibody was also low titer.  I  will repeat labs today.  He has no typical features of autoimmune disease on my examination.  He denies any history of oral ulcers, malar rash, Raynaud's phenomenon, photosensitivity or lymphadenopathy.  Plan: Urinalysis, Routine w reflex microscopic, ANA, Sjogrens syndrome-A extractable nuclear antibody, C3 and C4, Beta-2 glycoprotein antibodies, Cardiolipin antibodies, IgG, IgM, IgA, Lupus Anticoagulant Eval w/Reflex  SS-A antibody positive-low titer.  He has dry mouth which most likely is related to the medications.  There is no symptoms of dry eyes.  Chronic right shoulder pain -he has been having intermittent pain and discomfort in his right shoulder.  He has difficulty raising his right shoulder.  Plan: XR Shoulder Right.  X-ray of the shoulder joint was unremarkable.  Pain in both hands -he complains of pain and discomfort in his bilateral hands.  No warmth swelling or effusion was noted.  He describes tenderness over PIP joints.  He plays piano and he has difficulty playing piano at times.  Plan: XR Hand 2 View Right, XR Hand 2 View Left, x-rays were consistent with osteoarthritis.  Rheumatoid factor, Cyclic citrul peptide antibody, IgG, Sedimentation rate  Chronic pain of right knee -he had difficulty walking due to right knee joint.  No warmth swelling or effusion was noted.  Plan: XR KNEE 3 VIEW RIGHT.  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  Myalgia-he gives history of generalized muscle pain since he was 45 years old.  He states he is in constant pain and episodic increased pain.  He states that the pain is so severe that he misses work 2-3 times a week.  He has not noticed any improvement on prednisone.  He has been on prednisone 5 mg p.o. daily since May 2022.  I advised him to decrease prednisone to 1 tablet every other day for a month and then discontinue it.  I am concerned that he may have myofascial pain syndrome or fibromyalgia syndrome.  Other fatigue -  Plan: CBC with Differential/Platelet, COMPLETE  METABOLIC PANEL WITH GFR, CK, TSH, Serum protein electrophoresis with reflex, Glucose 6 phosphate dehydrogenase  Primary insomnia-he gives history of chronic insomnia for many years.  He states he has underlying insomnia and then the insomnia gets worse due to severe pain.  Essential hypertension-his blood pressure was elevated today which she relates to discomfort.  History of hyperlipidemia  Palpitations  History of asthma-he uses inhalers.  He states his asthma symptoms are better on prednisone.  History of gastroesophageal reflux (GERD)  Hx of migraines-he was treated by neurologist in the past.  He states the migraines are less frequent now.  He gives history of migraines for the last 20 years.  Essential tremor  History of anemia  Family history of systemic lupus erythematosus - Maternal aunt  Family history of rheumatoid arthritis - Maternal aunt  Orders: Orders Placed This Encounter  Procedures   XR KNEE 3 VIEW RIGHT   XR Hand 2 View Right   XR Hand 2 View Left   XR Shoulder Right   CBC with Differential/Platelet   COMPLETE METABOLIC PANEL WITH GFR   Urinalysis, Routine w reflex microscopic   CK   TSH   Rheumatoid factor   Cyclic citrul peptide antibody, IgG   ANA   Sjogrens syndrome-A extractable nuclear antibody   C3 and C4   Beta-2 glycoprotein antibodies   Cardiolipin antibodies, IgG, IgM, IgA   Lupus Anticoagulant Eval w/Reflex   Serum protein electrophoresis with reflex   Glucose 6 phosphate dehydrogenase   Sedimentation rate    No orders of the defined types were placed in this encounter.   Follow-Up Instructions: Return for Arthalgias, myalgias.   Bo Merino, MD  Note - This record has been created using Editor, commissioning.  Chart creation errors have been sought, but may not always  have been located. Such creation errors do not reflect on  the standard of medical care.

## 2020-12-28 ENCOUNTER — Ambulatory Visit: Payer: Self-pay

## 2020-12-28 ENCOUNTER — Other Ambulatory Visit: Payer: Self-pay

## 2020-12-28 ENCOUNTER — Encounter: Payer: Self-pay | Admitting: Rheumatology

## 2020-12-28 ENCOUNTER — Ambulatory Visit: Payer: BC Managed Care – PPO | Admitting: Rheumatology

## 2020-12-28 VITALS — BP 165/92 | HR 97 | Resp 16 | Ht 73.0 in | Wt 326.0 lb

## 2020-12-28 DIAGNOSIS — Z8719 Personal history of other diseases of the digestive system: Secondary | ICD-10-CM

## 2020-12-28 DIAGNOSIS — I10 Essential (primary) hypertension: Secondary | ICD-10-CM

## 2020-12-28 DIAGNOSIS — G8929 Other chronic pain: Secondary | ICD-10-CM | POA: Diagnosis not present

## 2020-12-28 DIAGNOSIS — Z8639 Personal history of other endocrine, nutritional and metabolic disease: Secondary | ICD-10-CM

## 2020-12-28 DIAGNOSIS — Z8261 Family history of arthritis: Secondary | ICD-10-CM

## 2020-12-28 DIAGNOSIS — M79641 Pain in right hand: Secondary | ICD-10-CM | POA: Diagnosis not present

## 2020-12-28 DIAGNOSIS — R7689 Other specified abnormal immunological findings in serum: Secondary | ICD-10-CM

## 2020-12-28 DIAGNOSIS — M25511 Pain in right shoulder: Secondary | ICD-10-CM | POA: Diagnosis not present

## 2020-12-28 DIAGNOSIS — Z8269 Family history of other diseases of the musculoskeletal system and connective tissue: Secondary | ICD-10-CM

## 2020-12-28 DIAGNOSIS — Z8709 Personal history of other diseases of the respiratory system: Secondary | ICD-10-CM

## 2020-12-28 DIAGNOSIS — G250 Essential tremor: Secondary | ICD-10-CM

## 2020-12-28 DIAGNOSIS — M25561 Pain in right knee: Secondary | ICD-10-CM | POA: Diagnosis not present

## 2020-12-28 DIAGNOSIS — M791 Myalgia, unspecified site: Secondary | ICD-10-CM

## 2020-12-28 DIAGNOSIS — Z8669 Personal history of other diseases of the nervous system and sense organs: Secondary | ICD-10-CM

## 2020-12-28 DIAGNOSIS — F5101 Primary insomnia: Secondary | ICD-10-CM

## 2020-12-28 DIAGNOSIS — M2550 Pain in unspecified joint: Secondary | ICD-10-CM | POA: Diagnosis not present

## 2020-12-28 DIAGNOSIS — M79642 Pain in left hand: Secondary | ICD-10-CM

## 2020-12-28 DIAGNOSIS — R768 Other specified abnormal immunological findings in serum: Secondary | ICD-10-CM

## 2020-12-28 DIAGNOSIS — R5383 Other fatigue: Secondary | ICD-10-CM

## 2020-12-28 DIAGNOSIS — Z862 Personal history of diseases of the blood and blood-forming organs and certain disorders involving the immune mechanism: Secondary | ICD-10-CM

## 2020-12-28 DIAGNOSIS — R002 Palpitations: Secondary | ICD-10-CM

## 2021-01-07 LAB — CBC WITH DIFFERENTIAL/PLATELET
Absolute Monocytes: 516 cells/uL (ref 200–950)
Basophils Absolute: 80 cells/uL (ref 0–200)
Basophils Relative: 1.2 %
Eosinophils Absolute: 161 cells/uL (ref 15–500)
Eosinophils Relative: 2.4 %
HCT: 47.3 % (ref 38.5–50.0)
Hemoglobin: 14 g/dL (ref 13.2–17.1)
Lymphs Abs: 2211 cells/uL (ref 850–3900)
MCH: 19.6 pg — ABNORMAL LOW (ref 27.0–33.0)
MCHC: 29.6 g/dL — ABNORMAL LOW (ref 32.0–36.0)
MCV: 66.3 fL — ABNORMAL LOW (ref 80.0–100.0)
MPV: 9.9 fL (ref 7.5–12.5)
Monocytes Relative: 7.7 %
Neutro Abs: 3732 cells/uL (ref 1500–7800)
Neutrophils Relative %: 55.7 %
Platelets: 271 10*3/uL (ref 140–400)
RBC: 7.13 10*6/uL — ABNORMAL HIGH (ref 4.20–5.80)
RDW: 18.2 % — ABNORMAL HIGH (ref 11.0–15.0)
Total Lymphocyte: 33 %
WBC: 6.7 10*3/uL (ref 3.8–10.8)

## 2021-01-07 LAB — URINALYSIS, ROUTINE W REFLEX MICROSCOPIC
Bilirubin Urine: NEGATIVE
Glucose, UA: NEGATIVE
Hgb urine dipstick: NEGATIVE
Ketones, ur: NEGATIVE
Leukocytes,Ua: NEGATIVE
Nitrite: NEGATIVE
Protein, ur: NEGATIVE
Specific Gravity, Urine: 1.033 (ref 1.001–1.035)
pH: 5.5 (ref 5.0–8.0)

## 2021-01-07 LAB — GLUCOSE 6 PHOSPHATE DEHYDROGENASE: G-6PDH: 26.2 U/g Hgb — ABNORMAL HIGH (ref 7.0–20.5)

## 2021-01-07 LAB — ANTI-NUCLEAR AB-TITER (ANA TITER): ANA Titer 1: 1:40 {titer} — ABNORMAL HIGH

## 2021-01-07 LAB — COMPLETE METABOLIC PANEL WITH GFR
AG Ratio: 1.4 (calc) (ref 1.0–2.5)
ALT: 22 U/L (ref 9–46)
AST: 16 U/L (ref 10–40)
Albumin: 4.2 g/dL (ref 3.6–5.1)
Alkaline phosphatase (APISO): 67 U/L (ref 36–130)
BUN: 18 mg/dL (ref 7–25)
CO2: 22 mmol/L (ref 20–32)
Calcium: 9.5 mg/dL (ref 8.6–10.3)
Chloride: 106 mmol/L (ref 98–110)
Creat: 1.26 mg/dL (ref 0.60–1.29)
Globulin: 3.1 g/dL (calc) (ref 1.9–3.7)
Glucose, Bld: 98 mg/dL (ref 65–99)
Potassium: 4.2 mmol/L (ref 3.5–5.3)
Sodium: 140 mmol/L (ref 135–146)
Total Bilirubin: 0.2 mg/dL (ref 0.2–1.2)
Total Protein: 7.3 g/dL (ref 6.1–8.1)
eGFR: 71 mL/min/{1.73_m2} (ref >=60)

## 2021-01-07 LAB — PROTEIN ELECTROPHORESIS, SERUM, WITH REFLEX
Albumin ELP: 4.4 g/dL (ref 3.8–4.8)
Alpha 1: 0.3 g/dL (ref 0.2–0.3)
Alpha 2: 0.7 g/dL (ref 0.5–0.9)
Beta 2: 0.4 g/dL (ref 0.2–0.5)
Beta Globulin: 0.5 g/dL (ref 0.4–0.6)
Gamma Globulin: 1.2 g/dL (ref 0.8–1.7)
Total Protein: 7.5 g/dL (ref 6.1–8.1)

## 2021-01-07 LAB — SEDIMENTATION RATE: Sed Rate: 2 mm/h (ref 0–15)

## 2021-01-07 LAB — CARDIOLIPIN ANTIBODIES, IGG, IGM, IGA
Anticardiolipin IgA: <2 APL-U/mL
Anticardiolipin IgG: <2 GPL-U/mL
Anticardiolipin IgM: <2 MPL-U/mL

## 2021-01-07 LAB — LUPUS ANTICOAGULANT EVAL W/ REFLEX
PTT-LA Screen: 38 s (ref <=40)
dRVVT: 38 s (ref <=45)

## 2021-01-07 LAB — SJOGRENS SYNDROME-A EXTRACTABLE NUCLEAR ANTIBODY: SSA (Ro) (ENA) Antibody, IgG: 1.1 AI — AB

## 2021-01-07 LAB — ANA: Anti Nuclear Antibody (ANA): POSITIVE — AB

## 2021-01-07 LAB — C3 AND C4
C3 Complement: 175 mg/dL (ref 82–185)
C4 Complement: 39 mg/dL (ref 15–53)

## 2021-01-07 LAB — BETA-2 GLYCOPROTEIN ANTIBODIES
Beta-2 Glyco 1 IgA: <2 U/mL
Beta-2 Glyco 1 IgM: 5.6 U/mL
Beta-2 Glyco I IgG: <2 U/mL

## 2021-01-07 LAB — TSH: TSH: 5.28 mIU/L — ABNORMAL HIGH (ref 0.40–4.50)

## 2021-01-07 LAB — CK: Total CK: 182 U/L (ref 44–196)

## 2021-01-07 LAB — RHEUMATOID FACTOR: Rheumatoid fact SerPl-aCnc: <14 IU/mL (ref <14)

## 2021-01-07 LAB — CYCLIC CITRUL PEPTIDE ANTIBODY, IGG: Cyclic Citrullin Peptide Ab: <16 UNITS

## 2021-01-17 NOTE — Progress Notes (Signed)
Office Visit Note  Patient: Randy Lowe             Date of Birth: 09/04/1975           MRN: [id number]             PCP: Chaney Malling, PA Referring: Chaney Malling, [place] Visit Date: 01/21/2021 Occupation: @GUAROCC @  Subjective:  Pain in multiple joints and muscles   History of Present Illness: Randy Lowe is a 45 y.o. male with a history of osteoarthritis, myofascial pain and positive Ro antibody.  He states the dry mouth symptoms are related to taking allergy medications.  He continues to have discomfort in his right shoulder, and bilateral knee joints.  He also has generalized pain and discomfort.  He states all of his muscles are sore.  He gives history of chronic insomnia.  Activities of Daily Living:  Patient reports morning stiffness for 45 minutes.   Patient Reports nocturnal pain.  Difficulty dressing/grooming: Denies Difficulty climbing stairs: Reports Difficulty getting out of chair: Reports Difficulty using hands for taps, buttons, cutlery, and/or writing: Reports  Review of Systems  Constitutional:  Positive for fatigue. Negative for night sweats.  HENT:  Positive for mouth dryness. Negative for mouth sores and nose dryness.   Eyes:  Negative for redness and dryness.  Respiratory:  Negative for difficulty breathing.   Cardiovascular:  Positive for palpitations. Negative for chest pain, hypertension and irregular heartbeat.  Gastrointestinal:  Positive for constipation. Negative for diarrhea.  Endocrine: Positive for cold intolerance, heat intolerance and excessive thirst. Negative for increased urination.  Genitourinary:  Negative for difficulty urinating.  Musculoskeletal:  Positive for joint pain, gait problem, joint pain, morning stiffness and muscle tenderness. Negative for joint swelling, myalgias, muscle weakness and myalgias.  Skin:  Negative for color change, rash, hair loss, nodules/bumps, skin tightness, ulcers and sensitivity to sunlight.   Allergic/Immunologic: Positive for susceptible to infections.  Neurological:  Positive for numbness and weakness. Negative for dizziness, fainting, memory loss and night sweats.  Hematological:  Negative for bruising/bleeding tendency and swollen glands.  Psychiatric/Behavioral:  Positive for sleep disturbance. Negative for depressed mood. The patient is not nervous/anxious.    PMFS History:  Patient Active Problem List   Diagnosis Date Noted   Essential hypertension 01/09/2020   Exertional dyspnea 12/22/2019   Palpitations 12/09/2019   Essential tremor 03/05/2018   Chronic migraine 11/16/2017   Asthma 07/21/2016   HYPERLIPIDEMIA 07/12/2008   Gastroesophageal reflux disease 07/12/2008   RECTAL BLEEDING 07/12/2008   ABDOMINAL PAIN, UNSPECIFIED SITE 07/12/2008   ANEMIA, HX OF 07/12/2008    Past Medical History:  Diagnosis Date   Asthma    GERD (gastroesophageal reflux disease)    Migraine     Family History  Problem Relation Age of Onset   CAD Father    Colon cancer Father    Heart attack Father    CVA Maternal Grandmother    CAD Maternal Grandmother    CAD Maternal Grandfather    Congestive Heart Failure Maternal Grandfather    Hypertension Mother    Breast cancer Mother    Hypertension Sister    Migraines Neg Hx    Past Surgical History:  Procedure Laterality Date   APPENDECTOMY     CHOLECYSTECTOMY     TONSILLECTOMY     Social History   Social History Narrative   Not on file   Immunization History  Administered Date(s) Administered   PFIZER(Purple Top)SARS-COV-2 Vaccination 07/15/2019, 08/05/2019  Objective: Vital Signs: BP (!) 139/93 (BP Location: Left Arm, Patient Position: Sitting, Cuff Size: Large)   Pulse 95   Resp 14   Ht $R'6\' 1"'Bl$  (1.854 m)   Wt (!) 326 lb (147.9 kg)   BMI 43.01 kg/m    Physical Exam Vitals and nursing note reviewed.  Constitutional:      Appearance: He is well-developed.  HENT:     Head: Normocephalic and atraumatic.   Eyes:     Conjunctiva/sclera: Conjunctivae normal.     Pupils: Pupils are equal, round, and reactive to light.  Cardiovascular:     Rate and Rhythm: Normal rate and regular rhythm.     Heart sounds: Normal heart sounds.  Pulmonary:     Effort: Pulmonary effort is normal.     Breath sounds: Normal breath sounds.  Abdominal:     General: Bowel sounds are normal.     Palpations: Abdomen is soft.  Musculoskeletal:     Cervical back: Normal range of motion and neck supple.  Skin:    General: Skin is warm and dry.     Capillary Refill: Capillary refill takes less than 2 seconds.  Neurological:     Mental Status: He is alert and oriented to person, place, and time.  Psychiatric:        Behavior: Behavior normal.     Musculoskeletal Exam: C-spine was in good range of motion.  Shoulder joints were in good range of motion with discomfort with range of motion of his right shoulder joint.  He had discomfort with forward flexion and abduction of his right shoulder joint.  Left shoulder joint with full range of motion.  Elbow joints, wrist joints, MCPs PIPs and DIPs with good range of motion with no synovitis.  Hip joints with good range of motion.  He had discomfort range of motion of bilateral knee joints with some crepitus.  No warmth swelling or effusion was noted.  There was no tenderness over ankles or MTPs.  CDAI Exam: CDAI Score: -- Patient Global: --; Provider Global: -- Swollen: --; Tender: -- Joint Exam 01/21/2021   No joint exam has been documented for this visit   There is currently no information documented on the homunculus. Go to the Rheumatology activity and complete the homunculus joint exam.  Investigation: No additional findings.  Imaging: XR Hand 2 View Left  Result Date: 12/28/2020 [facility] and PIP narrowing was noted.  No MCP, intercarpal or radiocarpal joint space narrowing was noted.  No erosive changes were noted. Impression: These findings are consistent with  osteoarthritis of the hand  XR Hand 2 View Right  Result Date: 12/28/2020 [facility] and PIP narrowing was noted.  No MCP, intercarpal or radiocarpal joint space narrowing was noted.  No erosive changes were noted. Impression: These findings are consistent with osteoarthritis of the hand.  XR KNEE 3 VIEW RIGHT  Result Date: 12/28/2020 Mild medial compartment narrowing was noted.  Mild patellofemoral narrowing was noted.  No chondrocalcinosis was noted. Impression: These findings are consistent with mild osteoarthritis and mild chondromalacia patella.  XR Shoulder Right  Result Date: 12/28/2020 No glenohumeral or acromioclavicular joint space narrowing was noted.  No chondrocalcinosis was noted. Impression: Unremarkable x-ray of the shoulder joint.   Recent Labs: Lab Results  Component Value Date   WBC 6.7 12/28/2020   HGB 14.0 12/28/2020   PLT 271 12/28/2020   NA 140 12/28/2020   K 4.2 12/28/2020   CL 106 12/28/2020   CO2 22 12/28/2020  GLUCOSE 98 12/28/2020   BUN 18 12/28/2020   CREATININE 1.26 12/28/2020   BILITOT 0.2 12/28/2020   ALKPHOS 65 01/05/2018   AST 16 12/28/2020   ALT 22 12/28/2020   PROT 7.3 12/28/2020   PROT 7.5 12/28/2020   ALBUMIN 3.7 01/05/2018   CALCIUM 9.5 12/28/2020   GFRAA >60 01/05/2018   December 28, 2020 UA negative, CK182, TSH 5.28, SPEP negative, ANA 1: 40NS, anti-SSA 1.1, C3-C4 normal, beta-2 GP 1 negative, anticardiolipin negative, lupus anticoagulant negative, ESR 2, RF negative, anti-CCP negative    Speciality Comments: No specialty comments available.  Procedures:  No procedures performed Allergies: Meloxicam   Assessment / Plan:     Visit Diagnoses: Positive ANA (antinuclear antibody) - ANA is low titer positive which is not a significant value.  There is no history of oral ulcers, malar rash, Raynaud's, lymphadenopathy, photosensitivity.  The left findings were discussed with the patient at length.  He has no clinical features of  autoimmune disease.  I have advised him to contact me in case he develops any new symptoms.  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  He gives history of dry mouth, most likely related to the use of allergy medication.  Polyarthralgia - History of pain in multiple joints since he was 45 years old.  Chronic right shoulder pain - History of intermittent pain.  X-rays were unremarkable.  He has discomfort with forward flexion and abduction.  I offered cortisone injection which he declined.  I will refer him to physical therapy.  Primary osteoarthritis of both hands - No synovitis was noted.  Clinical and radiographic findings were consistent with osteoarthritis.  Joint protection muscle strengthening was discussed.  Primary osteoarthritis of right knee - No synovitis was noted.  Mild osteoarthritis and mild chondromalacia patella was noted on the x-rays.  He continues to have discomfort in his right knee.  I will refer him to physical therapy.  Chronic pain of right knee  Myalgia - History of episodic increased pain lasting for a few weeks.  CK normal.  His symptoms are most likely consistent with fibromyalgia.  Detailed counsel regarding fibromyalgia was provided.  Need for regular exercise was emphasized.  Would benefit from water aerobics and swimming.  Stretching exercises will be helpful.  I will refer him to integrative therapies.  Other fatigue - He gives history of chronic fatigue most likely due to insomnia and fibromyalgia.  Primary insomnia - History of chronic insomnia for many years.  Good sleep hygiene was discussed.  Palpitations - He gives history of palpitations.  Patient states he has had several EKGs over the last year.  Other medical problems are listed as follows:  Essential hypertension  History of hyperlipidemia  History of asthma  History of gastroesophageal reflux (GERD)  Hx of migraines  Essential tremor  Family history of  systemic lupus erythematosus  Family history of rheumatoid arthritis  Orders: No orders of the defined types were placed in this encounter.  No orders of the defined types were placed in this encounter.    Follow-Up Instructions: Return in about 6 months (around 07/22/2021) for Osteoarthritis, FMS.   Bo Merino, MD  Note - This record has been created using Editor, commissioning.  Chart creation errors have been sought, but may not always  have been located. Such creation errors do not reflect on  the standard of medical care.

## 2021-01-21 ENCOUNTER — Ambulatory Visit: Payer: BC Managed Care – PPO | Admitting: Rheumatology

## 2021-01-21 ENCOUNTER — Other Ambulatory Visit: Payer: Self-pay

## 2021-01-21 ENCOUNTER — Encounter: Payer: Self-pay | Admitting: Rheumatology

## 2021-01-21 VITALS — BP 139/93 | HR 95 | Resp 14 | Ht 73.0 in | Wt 326.0 lb

## 2021-01-21 DIAGNOSIS — M19042 Primary osteoarthritis, left hand: Secondary | ICD-10-CM

## 2021-01-21 DIAGNOSIS — M25511 Pain in right shoulder: Secondary | ICD-10-CM

## 2021-01-21 DIAGNOSIS — G250 Essential tremor: Secondary | ICD-10-CM

## 2021-01-21 DIAGNOSIS — Z8261 Family history of arthritis: Secondary | ICD-10-CM

## 2021-01-21 DIAGNOSIS — R5383 Other fatigue: Secondary | ICD-10-CM

## 2021-01-21 DIAGNOSIS — M2550 Pain in unspecified joint: Secondary | ICD-10-CM | POA: Diagnosis not present

## 2021-01-21 DIAGNOSIS — Z8719 Personal history of other diseases of the digestive system: Secondary | ICD-10-CM

## 2021-01-21 DIAGNOSIS — Z8269 Family history of other diseases of the musculoskeletal system and connective tissue: Secondary | ICD-10-CM

## 2021-01-21 DIAGNOSIS — Z8639 Personal history of other endocrine, nutritional and metabolic disease: Secondary | ICD-10-CM

## 2021-01-21 DIAGNOSIS — M19041 Primary osteoarthritis, right hand: Secondary | ICD-10-CM | POA: Diagnosis not present

## 2021-01-21 DIAGNOSIS — Z8709 Personal history of other diseases of the respiratory system: Secondary | ICD-10-CM

## 2021-01-21 DIAGNOSIS — I10 Essential (primary) hypertension: Secondary | ICD-10-CM

## 2021-01-21 DIAGNOSIS — R002 Palpitations: Secondary | ICD-10-CM

## 2021-01-21 DIAGNOSIS — Z8669 Personal history of other diseases of the nervous system and sense organs: Secondary | ICD-10-CM

## 2021-01-21 DIAGNOSIS — M791 Myalgia, unspecified site: Secondary | ICD-10-CM

## 2021-01-21 DIAGNOSIS — F5101 Primary insomnia: Secondary | ICD-10-CM

## 2021-01-21 DIAGNOSIS — M1711 Unilateral primary osteoarthritis, right knee: Secondary | ICD-10-CM

## 2021-01-21 DIAGNOSIS — G8929 Other chronic pain: Secondary | ICD-10-CM

## 2021-01-21 DIAGNOSIS — R768 Other specified abnormal immunological findings in serum: Secondary | ICD-10-CM | POA: Diagnosis not present

## 2021-01-21 DIAGNOSIS — M25561 Pain in right knee: Secondary | ICD-10-CM

## 2021-01-21 NOTE — Patient Instructions (Signed)
Knee Exercises °Ask your health care provider which exercises are safe for you. Do exercises exactly as told by your health care provider and adjust them as directed. It is normal to feel mild stretching, pulling, tightness, or discomfort as you do these exercises. Stop right away if you feel sudden pain or your pain gets worse. Do not begin these exercises until told by your health care provider. °Stretching and range-of-motion exercises °These exercises warm up your muscles and joints and improve the movement and flexibility of your knee. These exercises also help to relieve pain and swelling. °Knee extension, prone ° °Lie on your abdomen (prone position) on a bed. °Place your left / right knee just beyond the edge of the surface so your knee is not on the bed. You can put a towel under your left / right thigh just above your kneecap for comfort. °Relax your leg muscles and allow gravity to straighten your knee (extension). You should feel a stretch behind your left / right knee. °Hold this position for __________ seconds. °Scoot up so your knee is supported between repetitions. °Repeat __________ times. Complete this exercise __________ times a day. °Knee flexion, active ° °Lie on your back with both legs straight. If this causes back discomfort, bend your left / right knee so your foot is flat on the floor. °Slowly slide your left / right heel back toward your buttocks. Stop when you feel a gentle stretch in the front of your knee or thigh (flexion). °Hold this position for __________ seconds. °Slowly slide your left / right heel back to the starting position. °Repeat __________ times. Complete this exercise __________ times a day. °Quadriceps stretch, prone ° °Lie on your abdomen on a firm surface, such as a bed or padded floor. °Bend your left / right knee and hold your ankle. If you cannot reach your ankle or pant leg, loop a belt around your foot and grab the belt instead. °Gently pull your heel toward your  buttocks. Your knee should not slide out to the side. You should feel a stretch in the front of your thigh and knee (quadriceps). °Hold this position for __________ seconds. °Repeat __________ times. Complete this exercise __________ times a day. °Hamstring, supine ° °Lie on your back (supine position). °Loop a belt or towel over the ball of your left / right foot. The ball of your foot is on the walking surface, right under your toes. °Straighten your left / right knee and slowly pull on the belt to raise your leg until you feel a gentle stretch behind your knee (hamstring). °Do not let your knee bend while you do this. °Keep your other leg flat on the floor. °Hold this position for __________ seconds. °Repeat __________ times. Complete this exercise __________ times a day. °Strengthening exercises °These exercises build strength and endurance in your knee. Endurance is the ability to use your muscles for a long time, even after they get tired. °Quadriceps, isometric °This exercise strengthens the muscles in front of your thigh (quadriceps) without moving your knee joint (isometric). °Lie on your back with your left / right leg extended and your other knee bent. Put a rolled towel or small pillow under your knee if told by your health care provider. °Slowly tense the muscles in the front of your left / right thigh. You should see your kneecap slide up toward your hip or see increased dimpling just above the knee. This motion will push the back of the knee toward the floor. °  For __________ seconds, hold the muscle as tight as you can without increasing your pain. °Relax the muscles slowly and completely. °Repeat __________ times. Complete this exercise __________ times a day. °Straight leg raises °This exercise strengthens the muscles in front of your thigh (quadriceps) and the muscles that move your hips (hip flexors). °Lie on your back with your left / right leg extended and your other knee bent. °Tense the  muscles in the front of your left / right thigh. You should see your kneecap slide up or see increased dimpling just above the knee. Your thigh may even shake a bit. °Keep these muscles tight as you raise your leg 4-6 inches (10-15 cm) off the floor. Do not let your knee bend. °Hold this position for __________ seconds. °Keep these muscles tense as you lower your leg. °Relax your muscles slowly and completely after each repetition. °Repeat __________ times. Complete this exercise __________ times a day. °Hamstring, isometric ° °Lie on your back on a firm surface. °Bend your left / right knee about __________ degrees. °Dig your left / right heel into the surface as if you are trying to pull it toward your buttocks. Tighten the muscles in the back of your thighs (hamstring) to "dig" as hard as you can without increasing any pain. °Hold this position for __________ seconds. °Release the tension gradually and allow your muscles to relax completely for __________ seconds after each repetition. °Repeat __________ times. Complete this exercise __________ times a day. °Hamstring curls °If told by your health care provider, do this exercise while wearing ankle weights. Begin with __________lb / kg weights. Then increase the weight by 1 lb (0.5 kg) increments. Do not wear ankle weights that are more than __________lb / kg. °Lie on your abdomen with your legs straight. °Bend your left / right knee as far as you can without feeling pain. Keep your hips flat against the floor. °Hold this position for __________ seconds. °Slowly lower your leg to the starting position. °Repeat __________ times. Complete this exercise __________ times a day. °Squats °This exercise strengthens the muscles in front of your thigh and knee (quadriceps). °Stand in front of a table, with your feet and knees pointing straight ahead. You may rest your hands on the table for balance but not for support. °Slowly bend your knees and lower your hips like you  are going to sit in a chair. °Keep your weight over your heels, not over your toes. °Keep your lower legs upright so they are parallel with the table legs. °Do not let your hips go lower than your knees. °Do not bend lower than told by your health care provider. °If your knee pain increases, do not bend as low. °Hold the squat position for __________ seconds. °Slowly push with your legs to return to standing. Do not use your hands to pull yourself to standing. °Repeat __________ times. Complete this exercise __________ times a day. °Wall slides °This exercise strengthens the muscles in front of your thigh and knee (quadriceps). °Lean your back against a smooth wall or door, and walk your feet out 18-24 inches (46-61 cm) from it. °Place your feet hip-width apart. °Slowly slide down the wall or door until your knees bend __________ degrees. Keep your knees over your heels, not over your toes. Keep your knees in line with your hips. °Hold this position for __________ seconds. °Repeat __________ times. Complete this exercise __________ times a day. °Straight leg raises, side-lying °This exercise strengthens the muscles that rotate   the leg at the hip and move it away from your body (hip abductors). °Lie on your side with your left / right leg in the top position. Lie so your head, shoulder, knee, and hip line up. You may bend your bottom knee to help you keep your balance. °Roll your hips slightly forward so your hips are stacked directly over each other and your left / right knee is facing forward. °Leading with your heel, lift your top leg 4-6 inches (10-15 cm). You should feel the muscles in your outer hip lifting. °Do not let your foot drift forward. °Do not let your knee roll toward the ceiling. °Hold this position for __________ seconds. °Slowly return your leg to the starting position. °Let your muscles relax completely after each repetition. °Repeat __________ times. Complete this exercise __________ times a  day. °Straight leg raises, prone °This exercise stretches the muscles that move your hips away from the front of the pelvis (hip extensors). °Lie on your abdomen on a firm surface. You can put a pillow under your hips if that is more comfortable. °Tense the muscles in your buttocks and lift your left / right leg about 4-6 inches (10-15 cm). Keep your knee straight as you lift your leg. °Hold this position for __________ seconds. °Slowly lower your leg to the starting position. °Let your leg relax completely after each repetition. °Repeat __________ times. Complete this exercise __________ times a day. °This information is not intended to replace advice given to you by your health care provider. Make sure you discuss any questions you have with your health care provider. °Document Revised: 10/09/2020 Document Reviewed: 10/09/2020 °[employer] Patient Education © 2022 [employer] °Hand Exercises °Hand exercises can be helpful for almost anyone. These exercises can strengthen the hands, improve flexibility and movement, and increase blood flow to the hands. These results can make work and daily tasks easier. °Hand exercises can be especially helpful for people who have joint pain from arthritis or have nerve damage from overuse (carpal tunnel syndrome). °These exercises can also help people who have injured a hand. °Exercises °Most of these hand exercises are gentle stretching and motion exercises. It is usually safe to do them often throughout the day. Warming up your hands before exercise may help to reduce stiffness. You can do this with gentle massage or by placing your hands in warm water for 10-15 minutes. °It is normal to feel some stretching, pulling, tightness, or mild discomfort as you begin new exercises. This will gradually improve. Stop an exercise right away if you feel sudden, severe pain or your pain gets worse. Ask your health care provider which exercises are best for you. °Knuckle bend or "claw"  fist ° °Stand or sit with your arm, hand, and all five fingers pointed straight up. Make sure to keep your wrist straight during the exercise. °Gently bend your fingers down toward your palm until the tips of your fingers are touching the top of your palm. Keep your big knuckle straight and just bend the small knuckles in your fingers. °Hold this position for __________ seconds. °Straighten (extend) your fingers back to the starting position. °Repeat this exercise 5-10 times with each hand. °Full finger fist ° °Stand or sit with your arm, hand, and all five fingers pointed straight up. Make sure to keep your wrist straight during the exercise. °Gently bend your fingers into your palm until the tips of your fingers are touching the middle of your palm. °Hold this position for __________   seconds. °Extend your fingers back to the starting position, stretching every joint fully. °Repeat this exercise 5-10 times with each hand. °Straight fist °Stand or sit with your arm, hand, and all five fingers pointed straight up. Make sure to keep your wrist straight during the exercise. °Gently bend your fingers at the big knuckle, where your fingers meet your hand, and the middle knuckle. Keep the knuckle at the tips of your fingers straight and try to touch the bottom of your palm. °Hold this position for __________ seconds. °Extend your fingers back to the starting position, stretching every joint fully. °Repeat this exercise 5-10 times with each hand. °Tabletop ° °Stand or sit with your arm, hand, and all five fingers pointed straight up. Make sure to keep your wrist straight during the exercise. °Gently bend your fingers at the big knuckle, where your fingers meet your hand, as far down as you can while keeping the small knuckles in your fingers straight. Think of forming a tabletop with your fingers. °Hold this position for __________ seconds. °Extend your fingers back to the starting position, stretching every joint  fully. °Repeat this exercise 5-10 times with each hand. °Finger spread ° °Place your hand flat on a table with your palm facing down. Make sure your wrist stays straight as you do this exercise. °Spread your fingers and thumb apart from each other as far as you can until you feel a gentle stretch. Hold this position for __________ seconds. °Bring your fingers and thumb tight together again. Hold this position for __________ seconds. °Repeat this exercise 5-10 times with each hand. °Making circles ° °Stand or sit with your arm, hand, and all five fingers pointed straight up. Make sure to keep your wrist straight during the exercise. °Make a circle by touching the tip of your thumb to the tip of your index finger. °Hold for __________ seconds. Then open your hand wide. °Repeat this motion with your thumb and each finger on your hand. °Repeat this exercise 5-10 times with each hand. °Thumb motion ° °Sit with your forearm resting on a table and your wrist straight. Your thumb should be facing up toward the ceiling. Keep your fingers relaxed as you move your thumb. °Lift your thumb up as high as you can toward the ceiling. Hold for __________ seconds. °Bend your thumb across your palm as far as you can, reaching the tip of your thumb for the small finger (pinkie) side of your palm. Hold for __________ seconds. °Repeat this exercise 5-10 times with each hand. °Grip strengthening ° °Hold a stress ball or other soft ball in the middle of your hand. °Slowly increase the pressure, squeezing the ball as much as you can without causing pain. Think of bringing the tips of your fingers into the middle of your palm. All of your finger joints should bend when doing this exercise. °Hold your squeeze for __________ seconds, then relax. °Repeat this exercise 5-10 times with each hand. °Contact a health care provider if: °Your hand pain or discomfort gets much worse when you do an exercise. °Your hand pain or discomfort does not  improve within 2 hours after you exercise. °If you have any of these problems, stop doing these exercises right away. Do not do them again unless your health care provider says that you can. °Get help right away if: °You develop sudden, severe hand pain or swelling. If this happens, stop doing these exercises right away. Do not do them again unless your health care provider   says that you can. °This information is not intended to replace advice given to you by your health care provider. Make sure you discuss any questions you have with your health care provider. °Document Revised: 05/17/2020 Document Reviewed: 05/17/2020 °[employer] Patient Education © 2022 [employer] ° °

## 2021-01-22 ENCOUNTER — Encounter: Payer: Self-pay | Admitting: Rheumatology

## 2021-01-22 DIAGNOSIS — M2550 Pain in unspecified joint: Secondary | ICD-10-CM

## 2021-01-22 DIAGNOSIS — M791 Myalgia, unspecified site: Secondary | ICD-10-CM

## 2021-01-22 DIAGNOSIS — G8929 Other chronic pain: Secondary | ICD-10-CM

## 2021-01-22 DIAGNOSIS — M19042 Primary osteoarthritis, left hand: Secondary | ICD-10-CM

## 2021-01-22 DIAGNOSIS — M25561 Pain in right knee: Secondary | ICD-10-CM

## 2021-01-22 DIAGNOSIS — M1711 Unilateral primary osteoarthritis, right knee: Secondary | ICD-10-CM

## 2021-01-22 DIAGNOSIS — M19041 Primary osteoarthritis, right hand: Secondary | ICD-10-CM

## 2021-01-23 NOTE — Addendum Note (Signed)
Addended by: Audrie Lia on: 01/23/2021 04:33 PM   Modules accepted: Orders

## 2021-02-16 ENCOUNTER — Other Ambulatory Visit: Payer: Self-pay | Admitting: Cardiology

## 2021-02-16 DIAGNOSIS — I10 Essential (primary) hypertension: Secondary | ICD-10-CM

## 2021-05-22 ENCOUNTER — Other Ambulatory Visit: Payer: Self-pay | Admitting: Cardiology

## 2021-05-22 DIAGNOSIS — I10 Essential (primary) hypertension: Secondary | ICD-10-CM

## 2021-07-22 ENCOUNTER — Ambulatory Visit: Payer: BC Managed Care – PPO | Admitting: Physician Assistant

## 2022-01-16 IMAGING — CT CT CHEST W/O CM
1 series · 16 of 34 positions shown, 20 images · non-contrast
Comparison: 06/06/2003

CLINICAL DATA: Recent pneumonia status post 2 rounds of antibiotics

EXAM:
CT CHEST WITHOUT CONTRAST
TECHNIQUE: Multidetector CT imaging of the chest was performed following the
standard protocol without IV contrast.

[Series 2: chest w/(date) · axial · 0.93mm/px · z∈[-281,-39]mm · 16 of 138 slices shown, 20 images]
[im 11/138  mediastinal]
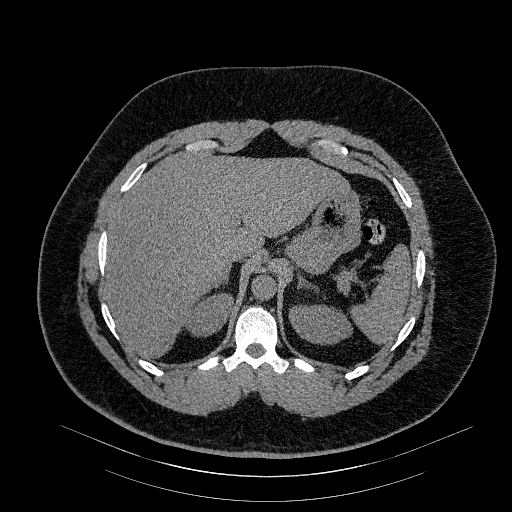
[im 11/138  lung]
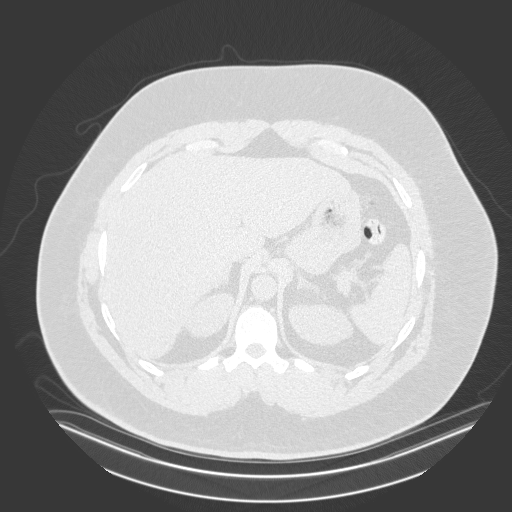
[im 21/138  lung]
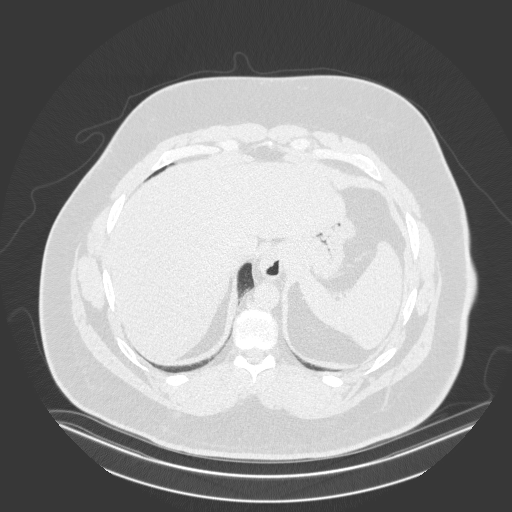
[im 28/138  lung]
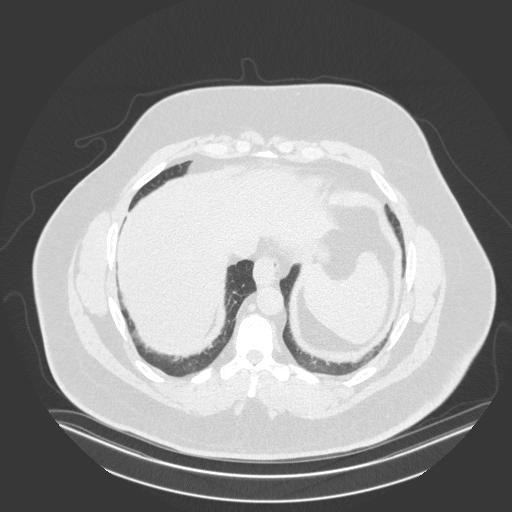
[im 36/138  lung]
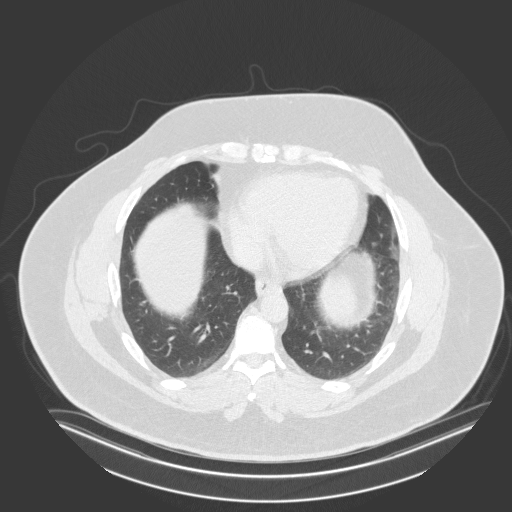
[im 46/138  mediastinal]
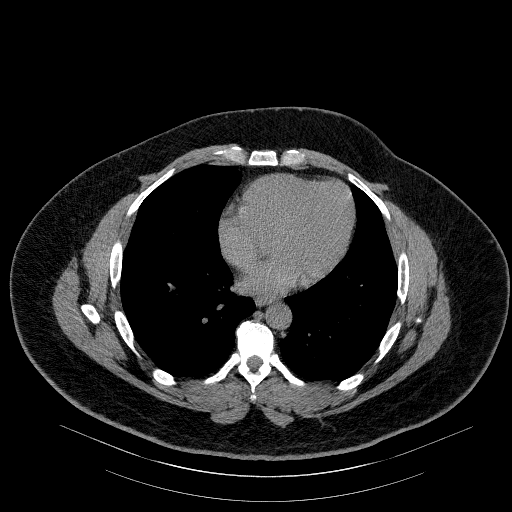
[im 46/138  lung]
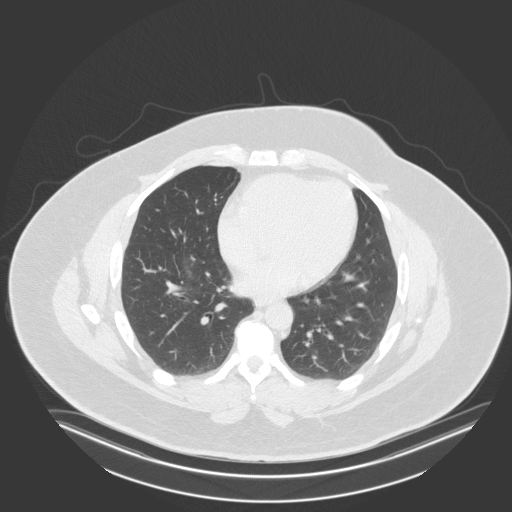
[im 55/138  lung]
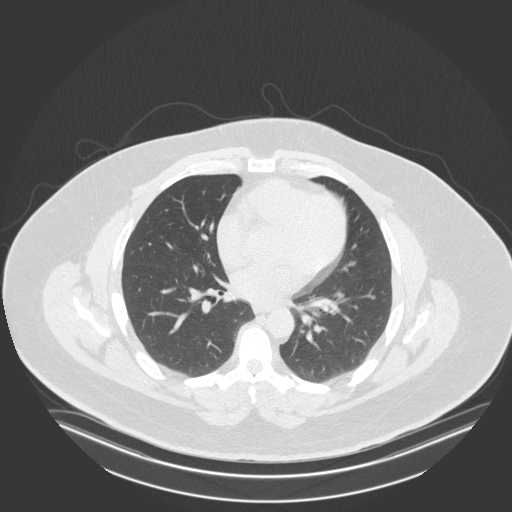
[im 61/138  lung]
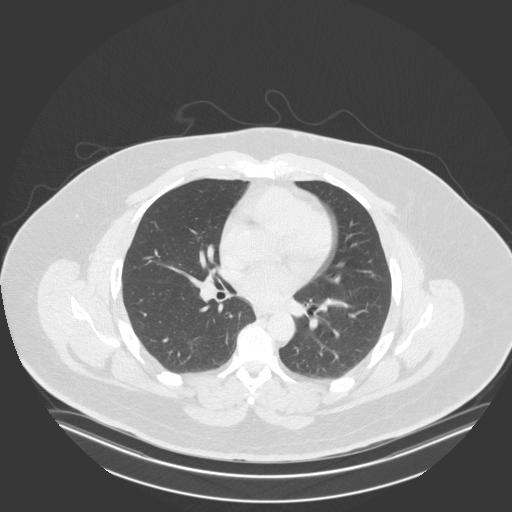
[im 66/138  lung]
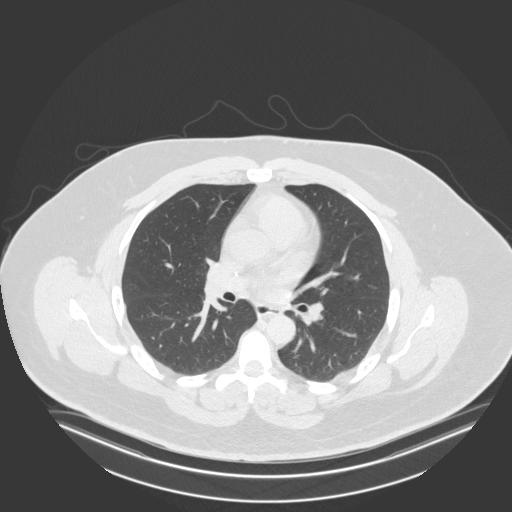
[im 74/138  mediastinal]
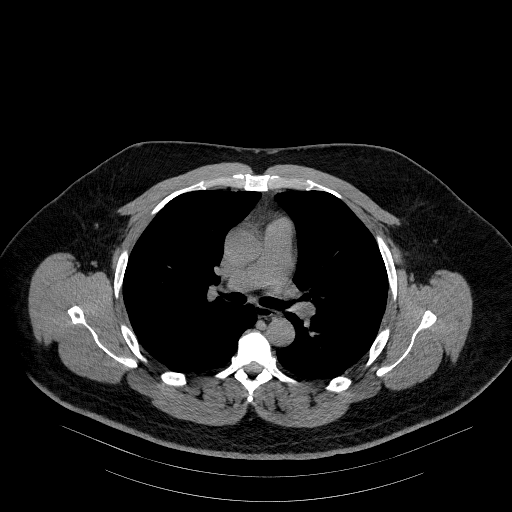
[im 74/138  lung]
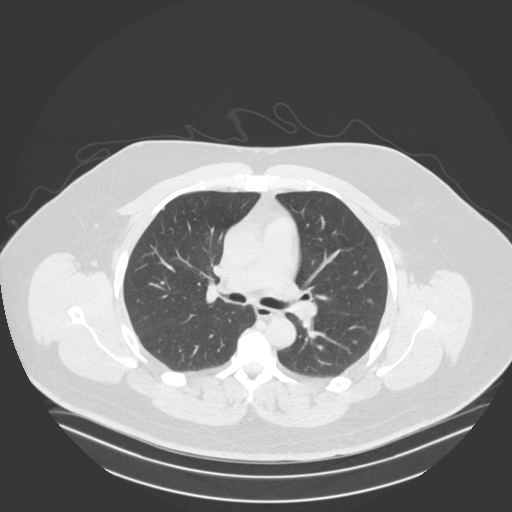
[im 82/138  lung]
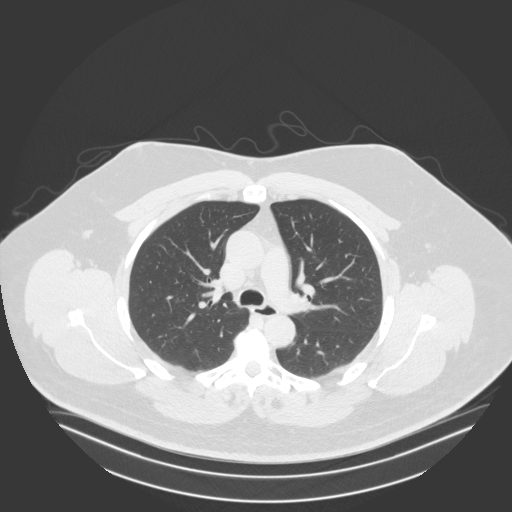
[im 87/138  lung]
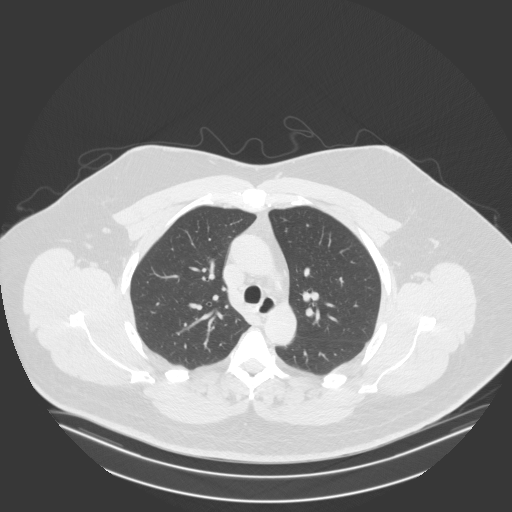
[im 97/138  lung]
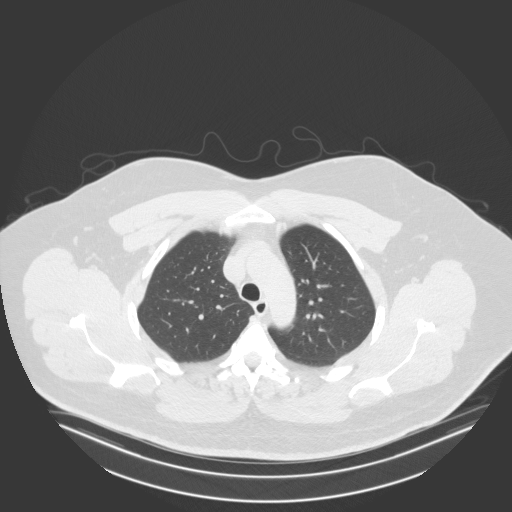
[im 107/138  mediastinal]
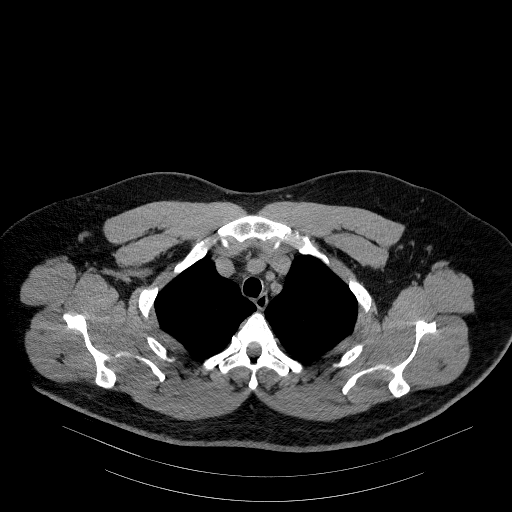
[im 107/138  lung]
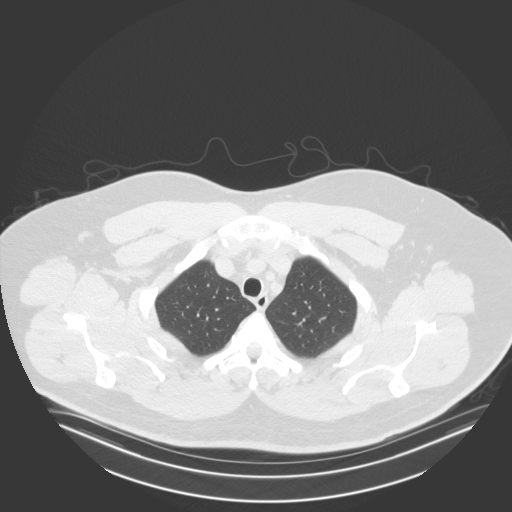
[im 112/138  lung]
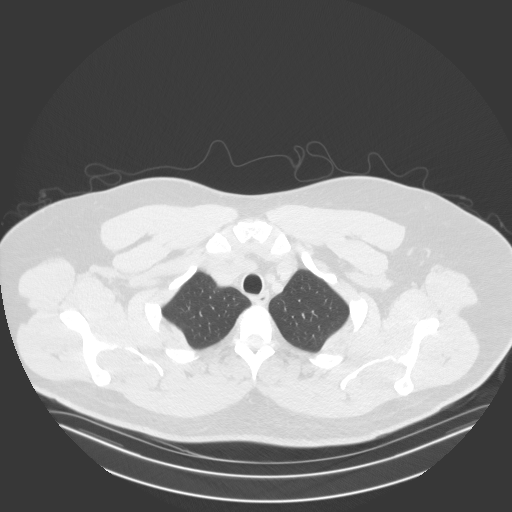
[im 122/138  lung]
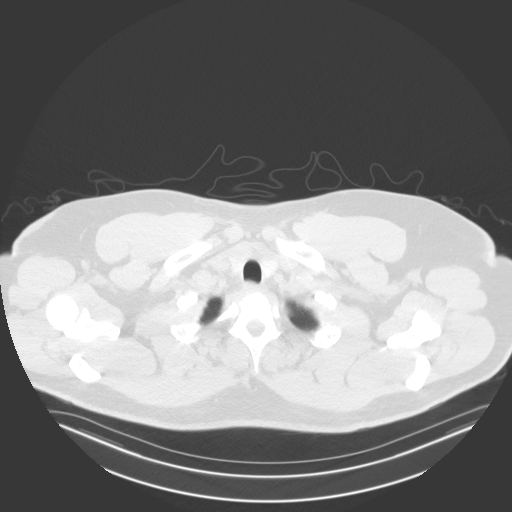
[im 132/138  lung]
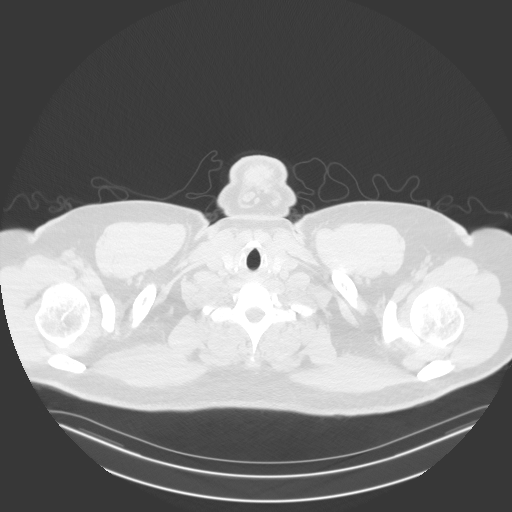

[16 of 34 positions shown; findings below may reference images not displayed]

FINDINGS: Cardiovascular: No pericardial effusion. Normal caliber of the
thoracic aorta.

Mediastinum/Nodes: No enlarged mediastinal or axillary lymph nodes.
Thyroid gland, trachea, and esophagus demonstrate no significant
findings.

Lungs/Pleura: No airspace disease, effusion, or pneumothorax.
Central airways are patent.

Upper Abdomen: No acute abnormality.

Musculoskeletal: No acute fracture. Reconstructed images demonstrate
no additional findings.
IMPRESSION: 1. No acute intrathoracic process.

## 2022-03-13 DIAGNOSIS — Z419 Encounter for procedure for purposes other than remedying health state, unspecified: Secondary | ICD-10-CM | POA: Diagnosis not present

## 2022-04-11 DIAGNOSIS — Z419 Encounter for procedure for purposes other than remedying health state, unspecified: Secondary | ICD-10-CM | POA: Diagnosis not present

## 2022-05-12 DIAGNOSIS — Z419 Encounter for procedure for purposes other than remedying health state, unspecified: Secondary | ICD-10-CM | POA: Diagnosis not present

## 2022-06-11 DIAGNOSIS — Z419 Encounter for procedure for purposes other than remedying health state, unspecified: Secondary | ICD-10-CM | POA: Diagnosis not present

## 2022-07-12 DIAGNOSIS — Z419 Encounter for procedure for purposes other than remedying health state, unspecified: Secondary | ICD-10-CM | POA: Diagnosis not present

## 2022-08-11 DIAGNOSIS — Z419 Encounter for procedure for purposes other than remedying health state, unspecified: Secondary | ICD-10-CM | POA: Diagnosis not present

## 2022-09-11 DIAGNOSIS — Z419 Encounter for procedure for purposes other than remedying health state, unspecified: Secondary | ICD-10-CM | POA: Diagnosis not present

## 2022-09-28 DIAGNOSIS — H6693 Otitis media, unspecified, bilateral: Secondary | ICD-10-CM | POA: Diagnosis not present

## 2022-10-12 DIAGNOSIS — Z419 Encounter for procedure for purposes other than remedying health state, unspecified: Secondary | ICD-10-CM | POA: Diagnosis not present

## 2022-11-02 DIAGNOSIS — Z8709 Personal history of other diseases of the respiratory system: Secondary | ICD-10-CM | POA: Diagnosis not present

## 2022-11-02 DIAGNOSIS — R051 Acute cough: Secondary | ICD-10-CM | POA: Diagnosis not present

## 2022-11-02 DIAGNOSIS — H6122 Impacted cerumen, left ear: Secondary | ICD-10-CM | POA: Diagnosis not present

## 2022-11-11 DIAGNOSIS — Z419 Encounter for procedure for purposes other than remedying health state, unspecified: Secondary | ICD-10-CM | POA: Diagnosis not present

## 2022-12-12 DIAGNOSIS — Z419 Encounter for procedure for purposes other than remedying health state, unspecified: Secondary | ICD-10-CM | POA: Diagnosis not present

## 2023-01-11 DIAGNOSIS — Z419 Encounter for procedure for purposes other than remedying health state, unspecified: Secondary | ICD-10-CM | POA: Diagnosis not present
